# Patient Record
Sex: Female | Born: 1985 | Race: Black or African American | Hispanic: No | Marital: Married | Smoking: Never smoker
Health system: Southern US, Community
[De-identification: ages and names within clinical notes are randomized; demographics above are authoritative.]

## PROBLEM LIST (undated history)

## (undated) DIAGNOSIS — O24419 Gestational diabetes mellitus in pregnancy, unspecified control: Secondary | ICD-10-CM

## (undated) DIAGNOSIS — E079 Disorder of thyroid, unspecified: Secondary | ICD-10-CM

## (undated) DIAGNOSIS — R079 Chest pain, unspecified: Secondary | ICD-10-CM

## (undated) DIAGNOSIS — E039 Hypothyroidism, unspecified: Secondary | ICD-10-CM

---
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
## 2006-05-23 ENCOUNTER — Emergency Department (HOSPITAL_COMMUNITY): Admission: AC | Admit: 2006-05-23 | Discharge: 2006-05-23 | Payer: Self-pay

## 2006-08-14 ENCOUNTER — Ambulatory Visit: Payer: Self-pay | Admitting: Obstetrics and Gynecology

## 2006-08-14 ENCOUNTER — Inpatient Hospital Stay (HOSPITAL_COMMUNITY): Admission: AD | Admit: 2006-08-14 | Discharge: 2006-08-14 | Payer: Self-pay | Admitting: Gynecology

## 2010-11-27 LAB — URINALYSIS, ROUTINE W REFLEX MICROSCOPIC
Bilirubin Urine: NEGATIVE
Glucose, UA: NEGATIVE
Hgb urine dipstick: NEGATIVE
Ketones, ur: NEGATIVE
Nitrite: NEGATIVE
Protein, ur: NEGATIVE
Specific Gravity, Urine: 1.01
Urobilinogen, UA: 0.2
pH: 6.5

## 2010-11-27 LAB — URINE MICROSCOPIC-ADD ON

## 2011-11-05 ENCOUNTER — Other Ambulatory Visit: Payer: Self-pay | Admitting: Obstetrics and Gynecology

## 2011-11-05 MED ORDER — ETONOGESTREL-ETHINYL ESTRADIOL 0.12-0.015 MG/24HR VA RING: VAGINAL_RING | VAGINAL | Status: DC

## 2011-11-05 NOTE — Telephone Encounter (Signed)
Lm on vm rx sent to pharm 

## 2011-11-21 ENCOUNTER — Ambulatory Visit: Payer: Self-pay | Admitting: Obstetrics and Gynecology

## 2013-04-12 ENCOUNTER — Other Ambulatory Visit: Payer: Self-pay | Admitting: Internal Medicine

## 2013-04-12 DIAGNOSIS — R002 Palpitations: Secondary | ICD-10-CM

## 2013-04-14 ENCOUNTER — Ambulatory Visit
Admission: RE | Admit: 2013-04-14 | Discharge: 2013-04-14 | Disposition: A | Discharge status: Home / Self Care | Payer: BC Managed Care – PPO | Source: Ambulatory Visit | Attending: Internal Medicine | Admitting: Internal Medicine

## 2013-04-14 DIAGNOSIS — R002 Palpitations: Secondary | ICD-10-CM

## 2013-04-27 ENCOUNTER — Other Ambulatory Visit: Payer: Self-pay | Admitting: Internal Medicine

## 2013-04-27 DIAGNOSIS — E049 Nontoxic goiter, unspecified: Secondary | ICD-10-CM

## 2013-05-06 ENCOUNTER — Ambulatory Visit (HOSPITAL_COMMUNITY)
Admission: RE | Admit: 2013-05-06 | Discharge: 2013-05-06 | Disposition: A | Discharge status: Home / Self Care | Payer: BC Managed Care – PPO | Source: Ambulatory Visit | Attending: Internal Medicine | Admitting: Internal Medicine

## 2013-05-06 ENCOUNTER — Ambulatory Visit (HOSPITAL_COMMUNITY): Payer: BC Managed Care – PPO

## 2013-05-06 DIAGNOSIS — E049 Nontoxic goiter, unspecified: Secondary | ICD-10-CM

## 2013-05-07 ENCOUNTER — Other Ambulatory Visit (HOSPITAL_COMMUNITY): Payer: BC Managed Care – PPO

## 2013-05-07 ENCOUNTER — Encounter (HOSPITAL_COMMUNITY)
Admission: RE | Admit: 2013-05-07 | Discharge: 2013-05-07 | Disposition: A | Discharge status: Home / Self Care | Payer: BC Managed Care – PPO | Source: Ambulatory Visit | Attending: Internal Medicine | Admitting: Internal Medicine

## 2013-05-07 DIAGNOSIS — E049 Nontoxic goiter, unspecified: Secondary | ICD-10-CM | POA: Diagnosis present

## 2013-05-07 MED ORDER — SODIUM IODIDE I 131 CAPSULE
7.0000 | Freq: Once | INTRAVENOUS | Status: AC | PRN
  Administered 2013-05-07: 7 via ORAL

## 2013-05-07 MED ORDER — SODIUM PERTECHNETATE TC 99M INJECTION
10.8000 | Freq: Once | INTRAVENOUS | Status: AC | PRN
  Administered 2013-05-07: 11 via INTRAVENOUS

## 2013-11-08 ENCOUNTER — Encounter (HOSPITAL_BASED_OUTPATIENT_CLINIC_OR_DEPARTMENT_OTHER): Payer: Self-pay | Admitting: Emergency Medicine

## 2013-11-08 ENCOUNTER — Emergency Department (HOSPITAL_BASED_OUTPATIENT_CLINIC_OR_DEPARTMENT_OTHER): Payer: Medicaid Other

## 2013-11-08 ENCOUNTER — Emergency Department (HOSPITAL_BASED_OUTPATIENT_CLINIC_OR_DEPARTMENT_OTHER)
Admission: EM | Admit: 2013-11-08 | Discharge: 2013-11-08 | Disposition: A | Discharge status: Home / Self Care | Payer: Medicaid Other | Attending: Emergency Medicine | Admitting: Emergency Medicine

## 2013-11-08 DIAGNOSIS — R221 Localized swelling, mass and lump, neck: Secondary | ICD-10-CM | POA: Diagnosis present

## 2013-11-08 DIAGNOSIS — R220 Localized swelling, mass and lump, head: Secondary | ICD-10-CM | POA: Diagnosis present

## 2013-11-08 DIAGNOSIS — IMO0001 Reserved for inherently not codable concepts without codable children: Secondary | ICD-10-CM | POA: Diagnosis not present

## 2013-11-08 DIAGNOSIS — R079 Chest pain, unspecified: Secondary | ICD-10-CM | POA: Diagnosis not present

## 2013-11-08 DIAGNOSIS — R5383 Other fatigue: Secondary | ICD-10-CM | POA: Diagnosis not present

## 2013-11-08 DIAGNOSIS — Z8639 Personal history of other endocrine, nutritional and metabolic disease: Secondary | ICD-10-CM | POA: Insufficient documentation

## 2013-11-08 DIAGNOSIS — Z862 Personal history of diseases of the blood and blood-forming organs and certain disorders involving the immune mechanism: Secondary | ICD-10-CM | POA: Diagnosis not present

## 2013-11-08 DIAGNOSIS — R5381 Other malaise: Secondary | ICD-10-CM | POA: Diagnosis not present

## 2013-11-08 DIAGNOSIS — R531 Weakness: Secondary | ICD-10-CM

## 2013-11-08 HISTORY — DX: Disorder of thyroid, unspecified: E07.9

## 2013-11-08 LAB — BASIC METABOLIC PANEL
Anion gap: 12 (ref 5–15)
BUN: 8 mg/dL (ref 6–23)
CO2: 25 mEq/L (ref 19–32)
Calcium: 9.5 mg/dL (ref 8.4–10.5)
Chloride: 102 mEq/L (ref 96–112)
Creatinine, Ser: 0.8 mg/dL (ref 0.50–1.10)
GFR calc Af Amer: >90 mL/min (ref >90)
GFR calc non Af Amer: >90 mL/min (ref >90)
Glucose, Bld: 97 mg/dL (ref 70–99)
Potassium: 4 mEq/L (ref 3.7–5.3)
Sodium: 139 mEq/L (ref 137–147)

## 2013-11-08 LAB — CBC WITH DIFFERENTIAL/PLATELET
Basophils Absolute: 0.1 10*3/uL (ref 0.0–0.1)
Basophils Relative: 1 % (ref 0–1)
Eosinophils Absolute: 0 10*3/uL (ref 0.0–0.7)
Eosinophils Relative: 0 % (ref 0–5)
HCT: 37.7 % (ref 36.0–46.0)
Hemoglobin: 13.1 g/dL (ref 12.0–15.0)
Lymphocytes Relative: 22 % (ref 12–46)
Lymphs Abs: 2.7 10*3/uL (ref 0.7–4.0)
MCH: 31 pg (ref 26.0–34.0)
MCHC: 34.7 g/dL (ref 30.0–36.0)
MCV: 89.3 fL (ref 78.0–100.0)
Monocytes Absolute: 1.1 10*3/uL — ABNORMAL HIGH (ref 0.1–1.0)
Monocytes Relative: 9 % (ref 3–12)
Neutro Abs: 8.5 10*3/uL — ABNORMAL HIGH (ref 1.7–7.7)
Neutrophils Relative %: 68 % (ref 43–77)
Platelets: 259 10*3/uL (ref 150–400)
RBC: 4.22 MIL/uL (ref 3.87–5.11)
RDW: 11.8 % (ref 11.5–15.5)
WBC: 12.3 10*3/uL — ABNORMAL HIGH (ref 4.0–10.5)

## 2013-11-08 LAB — TSH: TSH: 4.33 u[IU]/mL (ref 0.350–4.500)

## 2013-11-08 NOTE — ED Provider Notes (Signed)
CSN: [id number]     Arrival date & time 11/08/13  1832 History   First MD Initiated Contact with Patient 11/08/13 1931     Chief Complaint  Patient presents with  . Neck swelling      (Consider location/radiation/quality/duration/timing/severity/associated sxs/prior Treatment) Patient is a 28 y.o. female presenting with chest pain. The history is provided by the patient. No language interpreter was used.  Chest Pain Pain location:  L chest and R chest Pain quality: aching and tightness   Pain radiates to:  Does not radiate Associated symptoms: no abdominal pain, no dysphagia, no fever, no headache, no nausea, not vomiting and no weakness   Associated symptoms comment:  She has had a diagnosis of Hashimoto's disease earlier in the year, last seen by endocrinology in March of this year. She reports recent fatigue, chest tightness, increased fullness in her throat without difficulty swallowing solids or liquids, and muscle pain in her posterior and right neck. No fever, vomiting. She states her weight fluctuates up and down.    Past Medical History  Diagnosis Date  . Thyroid disease    History reviewed. No pertinent past surgical history. No family history on file. History  Substance Use Topics  . Smoking status: Never Smoker   . Smokeless tobacco: Not on file  . Alcohol Use: No   OB History   Grav Para Term Preterm Abortions TAB SAB Ect Mult Living                 Review of Systems  Constitutional: Negative for fever, chills and appetite change.  HENT: Negative.  Negative for trouble swallowing and voice change.        See HPI.  Respiratory: Negative.   Cardiovascular: Positive for chest pain.  Gastrointestinal: Negative.  Negative for nausea, vomiting and abdominal pain.  Genitourinary: Negative.  Negative for dysuria, vaginal discharge and menstrual problem.  Musculoskeletal: Positive for myalgias and neck pain.  Skin: Negative.  Negative for color change and rash.   Neurological: Negative.  Negative for weakness and headaches.      Allergies  Review of patient's allergies indicates no known allergies.  Home Medications   Prior to Admission medications   Medication Sig Start Date End Date Taking? Authorizing Provider  etonogestrel-ethinyl estradiol (NUVARING) 0.12-0.015 MG/24HR vaginal ring Insert vaginally and leave in place for 3 consecutive weeks, then remove for 1 week. 11/05/11   Hal Morales, MD   BP 135/91  Pulse 91  Temp(Src) 98.5 F (36.9 C) (Oral)  Resp 18  Ht  (1.549 m)  Wt 114 lb (51.71 kg)  BMI 21.55 kg/m2  SpO2 100%  LMP 11/07/2013 Physical Exam  Constitutional: She is oriented to person, place, and time. She appears well-developed and well-nourished.  HENT:  Head: Normocephalic.  Eyes: Conjunctivae are normal.  Neck: Normal range of motion. Neck supple. No thyromegaly present.  Cardiovascular: Normal rate and regular rhythm.   Pulmonary/Chest: Effort normal and breath sounds normal.  Abdominal: Soft. Bowel sounds are normal. There is no tenderness. There is no rebound and no guarding.  Musculoskeletal: Normal range of motion.  Neurological: She is alert and oriented to person, place, and time.  Skin: Skin is warm and dry. No rash noted.  Psychiatric: She has a normal mood and affect.    ED Course  Procedures (including critical care time) Labs Review Labs Reviewed  BASIC METABOLIC PANEL  CBC WITH DIFFERENTIAL   Results for orders placed during the hospital encounter of  11/08/13  BASIC METABOLIC PANEL      Result Value Ref Range   Sodium 139  137 - 147 mEq/L   Potassium 4.0  3.7 - 5.3 mEq/L   Chloride 102  96 - 112 mEq/L   CO2 25  19 - 32 mEq/L   Glucose, Bld 97  70 - 99 mg/dL   BUN 8  6 - 23 mg/dL   Creatinine, Ser 2.95  0.50 - 1.10 mg/dL   Calcium 9.5  8.4 - 62.1 mg/dL   GFR calc non Af Amer >90  >90 mL/min   GFR calc Af Amer >90  >90 mL/min   Anion gap 12  5 - 15  CBC WITH DIFFERENTIAL       Result Value Ref Range   WBC 12.3 (*) 4.0 - 10.5 K/uL   RBC 4.22  3.87 - 5.11 MIL/uL   Hemoglobin 13.1  12.0 - 15.0 g/dL   HCT 30.8  65.7 - 84.6 %   MCV 89.3  78.0 - 100.0 fL   MCH 31.0  26.0 - 34.0 pg   MCHC 34.7  30.0 - 36.0 g/dL   RDW 96.2  95.2 - 84.1 %   Platelets 259  150 - 400 K/uL   Neutrophils Relative % 68  43 - 77 %   Neutro Abs 8.5 (*) 1.7 - 7.7 K/uL   Lymphocytes Relative 22  12 - 46 %   Lymphs Abs 2.7  0.7 - 4.0 K/uL   Monocytes Relative 9  3 - 12 %   Monocytes Absolute 1.1 (*) 0.1 - 1.0 K/uL   Eosinophils Relative 0  0 - 5 %   Eosinophils Absolute 0.0  0.0 - 0.7 K/uL   Basophils Relative 1  0 - 1 %   Basophils Absolute 0.1  0.0 - 0.1 K/uL   Dg Chest 2 View  11/08/2013   CLINICAL DATA:  Chest tightness  EXAM: CHEST  2 VIEW  COMPARISON:  None.  FINDINGS: Lungs are clear. Heart size and pulmonary vascularity are normal. No adenopathy. No pneumothorax. No bone lesions.  IMPRESSION: No abnormality noted.   Electronically Signed   By: Bretta Bang M.D.   On: 11/08/2013 21:14    Imaging Review No results found.   EKG Interpretation None      MDM   Final diagnoses:  None    1. Weakness 2. History of hypothyroid  Lab studies and imaging today are essentially normal. Suspect current symptoms are related to history of thyroid disease that will require outpatient management. Thyroid studies obtained for her physician to follow up outpatient as well. VSS. She is well appearing and can be discharged home.     Arnoldo Hooker, PA-C 11/08/13 2136

## 2013-11-08 NOTE — ED Notes (Signed)
Patient transported to X-ray 

## 2013-11-08 NOTE — Discharge Instructions (Signed)
YOUR LAB AND X-RAY STUDIES TODAY ARE ESSENTIALLY NORMAL AND YOU CAN BE DISCHARGED HOME. IT IS IMPORTANT TO SEE YOUR PRIMARY CARE OR ENDOCRINE DOCTOR FOR FURTHER MANAGEMENT AND EVALUATION. RETURN TO THE EMERGENCY DEPARTMENT AS NEEDED.

## 2013-11-08 NOTE — ED Notes (Signed)
States she was diagnosed with Hashimoto disease in March. She was not started on medication. Her MD is watching her first. Swelling to her neck x 3 weeks and shoulders today. She is speaking in complete sentences and no difficulty swallowing.

## 2013-11-09 LAB — T4, FREE: Free T4: 1.24 ng/dL (ref 0.80–1.80)

## 2013-11-09 NOTE — ED Provider Notes (Signed)
Medical screening examination/treatment/procedure(s) were performed by non-physician practitioner and as supervising physician I was immediately available for consultation/collaboration.   EKG Interpretation None        Forrest Harrison, MD 11/09/13 2306 

## 2014-01-25 ENCOUNTER — Ambulatory Visit: Payer: BC Managed Care – PPO | Admitting: Internal Medicine

## 2014-01-26 ENCOUNTER — Encounter: Payer: Self-pay | Admitting: Internal Medicine

## 2014-01-26 ENCOUNTER — Ambulatory Visit: Payer: Medicaid Other | Attending: Internal Medicine | Admitting: Internal Medicine

## 2014-01-26 VITALS — BP 117/76 | HR 88 | Temp 98.2°F | Resp 16 | Ht 61.0 in | Wt 118.0 lb

## 2014-01-26 DIAGNOSIS — Z139 Encounter for screening, unspecified: Secondary | ICD-10-CM

## 2014-01-26 DIAGNOSIS — Z8639 Personal history of other endocrine, nutritional and metabolic disease: Secondary | ICD-10-CM

## 2014-01-26 LAB — COMPLETE METABOLIC PANEL WITH GFR
ALT: 15 U/L (ref 0–35)
AST: 20 U/L (ref 0–37)
Albumin: 3.9 g/dL (ref 3.5–5.2)
Alkaline Phosphatase: 57 U/L (ref 39–117)
BUN: 8 mg/dL (ref 6–23)
CO2: 26 mEq/L (ref 19–32)
Calcium: 9.3 mg/dL (ref 8.4–10.5)
Chloride: 104 mEq/L (ref 96–112)
Creat: 0.64 mg/dL (ref 0.50–1.10)
GFR, Est African American: >89 mL/min
GFR, Est Non African American: >89 mL/min
Glucose, Bld: 92 mg/dL (ref 70–99)
Potassium: 4.4 mEq/L (ref 3.5–5.3)
Sodium: 137 mEq/L (ref 135–145)
Total Bilirubin: 0.5 mg/dL (ref 0.2–1.2)
Total Protein: 6.9 g/dL (ref 6.0–8.3)

## 2014-01-26 NOTE — Progress Notes (Signed)
Patient Demographics  Rebecca Cooley, is a 28 y.o. female  ZOX:[id number]  [id number]  DOB - August 17, 1985  CC:  Chief Complaint  Patient presents with  . Establish Care       HPI: Rebecca Cooley is a 28 y.o. female here today to establish medical care.patient has history of thyroiditis in the past and had ultrasound done and which reported no nodules or cyst, in the month of September she had her TSH and free T4 level checked which was normal, patient reported to have gained a few pounds and does report heavy menstrual bleeding and wants to be checked again thyroid test, she denies any fever chills chest and shortness of breath. Patient has No headache, No chest pain, No abdominal pain - No Nausea, No new weakness tingling or numbness, No Cough - SOB.  No Known Allergies Past Medical History  Diagnosis Date  . Thyroid disease    Current Outpatient Prescriptions on File Prior to Visit  Medication Sig Dispense Refill  . etonogestrel-ethinyl estradiol (NUVARING) 0.12-0.015 MG/24HR vaginal ring Insert vaginally and leave in place for 3 consecutive weeks, then remove for 1 week. (Patient not taking: Reported on 01/26/2014) 1 each 0   No current facility-administered medications on file prior to visit.   Family History  Problem Relation Age of Onset  . Hypertension Mother   . Dementia Father   . Diabetes Maternal Grandmother    History   Social History  . Marital Status: Single    Spouse Name: N/A    Number of Children: N/A  . Years of Education: N/A   Occupational History  . Not on file.   Social History Main Topics  . Smoking status: Never Smoker   . Smokeless tobacco: Not on file  . Alcohol Use: No  . Drug Use: No  . Sexual Activity: Not on file   Other Topics Concern  . Not on file   Social History Narrative    Review of Systems: Constitutional: Negative for fever, chills, diaphoresis, activity change, appetite change and fatigue. HENT: Negative  for ear pain, nosebleeds, congestion, facial swelling, rhinorrhea, neck pain, neck stiffness and ear discharge.  Eyes: Negative for pain, discharge, redness, itching and visual disturbance. Respiratory: Negative for cough, choking, chest tightness, shortness of breath, wheezing and stridor.  Cardiovascular: Negative for chest pain, palpitations and leg swelling. Gastrointestinal: Negative for abdominal distention. Genitourinary: Negative for dysuria, urgency, frequency, hematuria, flank pain, decreased urine volume, difficulty urinating and dyspareunia.  Musculoskeletal: Negative for back pain, joint swelling, arthralgia and gait problem. Neurological: Negative for dizziness, tremors, seizures, syncope, facial asymmetry, speech difficulty, weakness, light-headedness, numbness and headaches.  Hematological: Negative for adenopathy. Does not bruise/bleed easily. Psychiatric/Behavioral: Negative for hallucinations, behavioral problems, confusion, dysphoric mood, decreased concentration and agitation.    Objective:   Filed Vitals:   01/26/14 1512  BP: 117/76  Pulse: 88  Temp: 98.2 F (36.8 C)  Resp: 16    Physical Exam: Constitutional: Patient appears well-developed and well-nourished. No distress. HENT: Normocephalic, atraumatic, External right and left ear normal. Oropharynx is clear and moist.  Eyes: Conjunctivae and EOM are normal. PERRLA, no scleral icterus. Neck: Normal ROM. Neck supple. No JVD. No tracheal deviation. + thyromegaly. CVS: RRR, S1/S2 +, no murmurs, no gallops, no carotid bruit.  Pulmonary: Effort and breath sounds normal, no stridor, rhonchi, wheezes, rales.  Abdominal: Soft. BS +, no distension, tenderness, rebound or guarding.  Musculoskeletal: Normal range of motion. No edema and no tenderness.  Neuro: Alert. Normal reflexes, muscle tone coordination. No cranial nerve deficit. Skin: Skin is warm and dry. No rash noted. Not diaphoretic. No erythema. No  pallor. Psychiatric: Normal mood and affect. Behavior, judgment, thought content normal.  Lab Results  Component Value Date   WBC 12.3* 11/08/2013   HGB 13.1 11/08/2013   HCT 37.7 11/08/2013   MCV 89.3 11/08/2013   PLT 259 11/08/2013   Lab Results  Component Value Date   CREATININE 0.80 11/08/2013   BUN 8 11/08/2013   NA 139 11/08/2013   K 4.0 11/08/2013   CL 102 11/08/2013   CO2 25 11/08/2013    No results found for: HGBA1C Lipid Panel  No results found for: CHOL, TRIG, HDL, CHOLHDL, VLDL, LDLCALC     Assessment and plan:   1. History of thyroiditis Will recheck  - T4, free;  - TSH  2. Screening Ordered baseline blood work. - CBC with Differential - COMPLETE METABOLIC PANEL WITH GFR - Vit D  25 hydroxy (rtn osteoporosis monitoring) - Hemoglobin A1c      Health Maintenance  -Pap Smear: uptodate   -Vaccinations:  Patient declines flu shot   Return in about 3 months (around 04/27/2014), or if symptoms worsen or fail to improve.   Doris CheadleADVANI, DEEPAK, MD

## 2014-01-26 NOTE — Progress Notes (Signed)
Establish Care Hx TSH, not taking medication

## 2014-01-27 LAB — CBC WITH DIFFERENTIAL/PLATELET
Basophils Absolute: 0.1 10*3/uL (ref 0.0–0.1)
Basophils Relative: 1 % (ref 0–1)
Eosinophils Absolute: 0.1 10*3/uL (ref 0.0–0.7)
Eosinophils Relative: 1 % (ref 0–5)
HCT: 36.7 % (ref 36.0–46.0)
Hemoglobin: 12.7 g/dL (ref 12.0–15.0)
Lymphocytes Relative: 18 % (ref 12–46)
Lymphs Abs: 1.8 10*3/uL (ref 0.7–4.0)
MCH: 29.8 pg (ref 26.0–34.0)
MCHC: 34.6 g/dL (ref 30.0–36.0)
MCV: 86.2 fL (ref 78.0–100.0)
MPV: 10.5 fL (ref 9.4–12.4)
Monocytes Absolute: 0.9 10*3/uL (ref 0.1–1.0)
Monocytes Relative: 9 % (ref 3–12)
Neutro Abs: 7 10*3/uL (ref 1.7–7.7)
Neutrophils Relative %: 71 % (ref 43–77)
Platelets: 289 10*3/uL (ref 150–400)
RBC: 4.26 MIL/uL (ref 3.87–5.11)
RDW: 12.9 % (ref 11.5–15.5)
WBC: 9.9 10*3/uL (ref 4.0–10.5)

## 2014-01-27 LAB — HEMOGLOBIN A1C
Hgb A1c MFr Bld: 5.6 % (ref <5.7)
Mean Plasma Glucose: 114 mg/dL (ref <117)

## 2014-01-27 LAB — T4, FREE: Free T4: 0.92 ng/dL (ref 0.80–1.80)

## 2014-01-27 LAB — TSH: TSH: 1.471 u[IU]/mL (ref 0.350–4.500)

## 2014-01-27 LAB — VITAMIN D 25 HYDROXY (VIT D DEFICIENCY, FRACTURES): Vit D, 25-Hydroxy: 24 ng/mL — ABNORMAL LOW (ref 30–100)

## 2014-05-03 ENCOUNTER — Encounter (HOSPITAL_BASED_OUTPATIENT_CLINIC_OR_DEPARTMENT_OTHER): Payer: Self-pay | Admitting: *Deleted

## 2014-05-03 ENCOUNTER — Other Ambulatory Visit: Payer: Self-pay | Admitting: Emergency Medicine

## 2014-05-03 ENCOUNTER — Emergency Department (HOSPITAL_BASED_OUTPATIENT_CLINIC_OR_DEPARTMENT_OTHER)
Admission: EM | Admit: 2014-05-03 | Discharge: 2014-05-03 | Disposition: A | Discharge status: Home / Self Care | Payer: Medicaid Other | Attending: Emergency Medicine | Admitting: Emergency Medicine

## 2014-05-03 DIAGNOSIS — Z8639 Personal history of other endocrine, nutritional and metabolic disease: Secondary | ICD-10-CM | POA: Diagnosis not present

## 2014-05-03 DIAGNOSIS — J020 Streptococcal pharyngitis: Secondary | ICD-10-CM | POA: Insufficient documentation

## 2014-05-03 DIAGNOSIS — J029 Acute pharyngitis, unspecified: Secondary | ICD-10-CM | POA: Diagnosis present

## 2014-05-03 LAB — RAPID STREP SCREEN (MED CTR MEBANE ONLY): Streptococcus, Group A Screen (Direct): NEGATIVE

## 2014-05-03 MED ORDER — DEXAMETHASONE 4 MG PO TABS
ORAL_TABLET | ORAL | Status: AC
  Filled 2014-05-03: qty 3

## 2014-05-03 MED ORDER — DEXAMETHASONE 4 MG PO TABS
10.0000 mg | ORAL_TABLET | Freq: Once | ORAL | Status: AC
  Administered 2014-05-03: 10 mg via ORAL

## 2014-05-03 MED ORDER — PENICILLIN G BENZATHINE 1200000 UNIT/2ML IM SUSP
1.2000 10*6.[IU] | Freq: Once | INTRAMUSCULAR | Status: AC
  Administered 2014-05-03: 1.2 10*6.[IU] via INTRAMUSCULAR
  Filled 2014-05-03: qty 2

## 2014-05-03 MED ORDER — DEXAMETHASONE 4 MG PO TABS
ORAL_TABLET | ORAL | Status: AC
  Administered 2014-05-03: 10 mg via ORAL
  Filled 2014-05-03: qty 3

## 2014-05-03 NOTE — ED Provider Notes (Addendum)
CSN: [id number]     Arrival date & time 05/03/14  1425 History   First MD Initiated Contact with Patient 05/03/14 1438     Chief Complaint  Patient presents with  . Sore Throat     (Consider location/radiation/quality/duration/timing/severity/associated sxs/prior Treatment) Patient is a 29 y.o. female presenting with pharyngitis.  Sore Throat This is a new problem. The current episode started yesterday. The problem occurs constantly. The problem has not changed since onset.Pertinent negatives include no chest pain, no abdominal pain, no headaches and no shortness of breath. The symptoms are aggravated by swallowing and eating. Nothing relieves the symptoms.    Past Medical History  Diagnosis Date  . Thyroid disease    History reviewed. No pertinent past surgical history. Family History  Problem Relation Age of Onset  . Hypertension Mother   . Dementia Father   . Diabetes Maternal Grandmother    History  Substance Use Topics  . Smoking status: Never Smoker   . Smokeless tobacco: Not on file  . Alcohol Use: No   OB History    No data available     Review of Systems  Respiratory: Negative for shortness of breath.   Cardiovascular: Negative for chest pain.  Gastrointestinal: Negative for abdominal pain.  Neurological: Negative for headaches.  All other systems reviewed and are negative.     Allergies  Review of patient's allergies indicates no known allergies.  Home Medications   Prior to Admission medications   Medication Sig Start Date End Date Taking? Authorizing Provider  etonogestrel-ethinyl estradiol (NUVARING) 0.12-0.015 MG/24HR vaginal ring Insert vaginally and leave in place for 3 consecutive weeks, then remove for 1 week. Patient not taking: Reported on 01/26/2014 11/05/11   Hal Morales, MD   BP 123/77 mmHg  Pulse 110  Temp(Src) 99.4 F (37.4 C) (Oral)  Resp 16  Ht  (1.549 m)  Wt 110 lb (49.896 kg)  BMI 20.80 kg/m2  SpO2 100%  LMP  04/04/2014 (Approximate) Physical Exam  Constitutional: She is oriented to person, place, and time. She appears well-developed and well-nourished.  HENT:  Head: Normocephalic and atraumatic.  Right Ear: External ear normal.  Left Ear: External ear normal.  Mouth/Throat: Posterior oropharyngeal edema and posterior oropharyngeal erythema (tonsillar exudate bil) present.  Eyes: Conjunctivae and EOM are normal. Pupils are equal, round, and reactive to light.  Neck: Normal range of motion. Neck supple.  Cardiovascular: Normal rate, regular rhythm, normal heart sounds and intact distal pulses.   Pulmonary/Chest: Effort normal and breath sounds normal.  Abdominal: Soft. Bowel sounds are normal. There is no tenderness.  Musculoskeletal: Normal range of motion.  Neurological: She is alert and oriented to person, place, and time.  Skin: Skin is warm and dry.  Vitals reviewed.   ED Course  Procedures (including critical care time) Labs Review Labs Reviewed  RAPID STREP SCREEN    Imaging Review No results found.   EKG Interpretation None      MDM   Final diagnoses:  Strep pharyngitis    29 y.o. female without pertinent PMH presents with sore throat x 1 day.  Physical exam with tender LAD, tonsillar exudate, posterior erythema.  She has been chilled at home, no measured fevers.  Consider strep likely.  Empirically treated with penicillin, decadron.     I have reviewed all laboratory and imaging studies if ordered as above  1. Strep pharyngitis         Mirian Mo, MD 05/03/14 1503  Mirian Mo,  MD 05/03/14 1539

## 2014-05-03 NOTE — Discharge Instructions (Signed)

## 2014-05-03 NOTE — ED Notes (Signed)
Pt c/o sore throat and cold chills that began last night.

## 2014-05-05 LAB — CULTURE, GROUP A STREP: Strep A Culture: NEGATIVE

## 2015-09-20 DIAGNOSIS — Z1159 Encounter for screening for other viral diseases: Secondary | ICD-10-CM | POA: Diagnosis not present

## 2015-09-20 DIAGNOSIS — N760 Acute vaginitis: Secondary | ICD-10-CM | POA: Diagnosis not present

## 2015-09-20 DIAGNOSIS — Z113 Encounter for screening for infections with a predominantly sexual mode of transmission: Secondary | ICD-10-CM | POA: Diagnosis not present

## 2015-09-20 DIAGNOSIS — Z114 Encounter for screening for human immunodeficiency virus [HIV]: Secondary | ICD-10-CM | POA: Diagnosis not present

## 2015-09-20 DIAGNOSIS — R350 Frequency of micturition: Secondary | ICD-10-CM | POA: Diagnosis not present

## 2015-10-09 DIAGNOSIS — E063 Autoimmune thyroiditis: Secondary | ICD-10-CM | POA: Diagnosis not present

## 2015-10-09 DIAGNOSIS — R002 Palpitations: Secondary | ICD-10-CM | POA: Diagnosis not present

## 2015-10-09 DIAGNOSIS — R9430 Abnormal result of cardiovascular function study, unspecified: Secondary | ICD-10-CM | POA: Diagnosis not present

## 2015-10-11 DIAGNOSIS — E063 Autoimmune thyroiditis: Secondary | ICD-10-CM | POA: Diagnosis not present

## 2015-10-24 DIAGNOSIS — Z8639 Personal history of other endocrine, nutritional and metabolic disease: Secondary | ICD-10-CM | POA: Diagnosis not present

## 2015-10-24 DIAGNOSIS — R002 Palpitations: Secondary | ICD-10-CM | POA: Diagnosis not present

## 2015-12-04 DIAGNOSIS — Z6821 Body mass index (BMI) 21.0-21.9, adult: Secondary | ICD-10-CM | POA: Diagnosis not present

## 2015-12-04 DIAGNOSIS — Z01419 Encounter for gynecological examination (general) (routine) without abnormal findings: Secondary | ICD-10-CM | POA: Diagnosis not present

## 2015-12-15 DIAGNOSIS — T300 Burn of unspecified body region, unspecified degree: Secondary | ICD-10-CM | POA: Diagnosis not present

## 2016-05-21 DIAGNOSIS — R002 Palpitations: Secondary | ICD-10-CM | POA: Diagnosis not present

## 2016-05-21 DIAGNOSIS — R5383 Other fatigue: Secondary | ICD-10-CM | POA: Diagnosis not present

## 2016-05-23 DIAGNOSIS — Z3042 Encounter for surveillance of injectable contraceptive: Secondary | ICD-10-CM | POA: Diagnosis not present

## 2016-08-20 DIAGNOSIS — Z3042 Encounter for surveillance of injectable contraceptive: Secondary | ICD-10-CM | POA: Diagnosis not present

## 2016-11-14 DIAGNOSIS — Z3202 Encounter for pregnancy test, result negative: Secondary | ICD-10-CM | POA: Diagnosis not present

## 2016-11-14 DIAGNOSIS — Z3042 Encounter for surveillance of injectable contraceptive: Secondary | ICD-10-CM | POA: Diagnosis not present

## 2016-12-05 DIAGNOSIS — Z1151 Encounter for screening for human papillomavirus (HPV): Secondary | ICD-10-CM | POA: Diagnosis not present

## 2016-12-05 DIAGNOSIS — Z01419 Encounter for gynecological examination (general) (routine) without abnormal findings: Secondary | ICD-10-CM | POA: Diagnosis not present

## 2016-12-05 DIAGNOSIS — Z6823 Body mass index (BMI) 23.0-23.9, adult: Secondary | ICD-10-CM | POA: Diagnosis not present

## 2017-02-06 DIAGNOSIS — Z3042 Encounter for surveillance of injectable contraceptive: Secondary | ICD-10-CM | POA: Diagnosis not present

## 2017-04-21 ENCOUNTER — Ambulatory Visit (INDEPENDENT_AMBULATORY_CARE_PROVIDER_SITE_OTHER): Payer: BLUE CROSS/BLUE SHIELD | Admitting: Family Medicine

## 2017-04-21 ENCOUNTER — Other Ambulatory Visit: Payer: Self-pay

## 2017-04-21 ENCOUNTER — Encounter: Payer: Self-pay | Admitting: Family Medicine

## 2017-04-21 VITALS — BP 110/62 | HR 109 | Temp 98.2°F | Ht 61.0 in | Wt 134.0 lb

## 2017-04-21 DIAGNOSIS — Z8639 Personal history of other endocrine, nutritional and metabolic disease: Secondary | ICD-10-CM

## 2017-04-21 DIAGNOSIS — M222X2 Patellofemoral disorders, left knee: Secondary | ICD-10-CM | POA: Diagnosis not present

## 2017-04-21 DIAGNOSIS — Z319 Encounter for procreative management, unspecified: Secondary | ICD-10-CM | POA: Diagnosis not present

## 2017-04-21 NOTE — Patient Instructions (Signed)
Thank you for coming to see me today. It was a pleasure! Today we talked about:   Your previous thyroiditis. We will call you with your lab results.  Your anterior knee pain. I have attached a hand out and stretches as well as walking periodically should help.   Please follow-up with me as needed.  If you have any questions or concerns, please do not hesitate to call the office at [phone].  Take Care,   [place] Shirley, DO  Knee Pain, Adult Many things can cause knee pain. The pain often goes away on its own with time and rest. If the pain does not go away, tests may be done to find out what is causing the pain. Follow these instructions at home: Activity  Rest your knee.  Do not do things that cause pain.  Avoid activities where both feet leave the ground at the same time (high-impact activities). Examples are running, jumping rope, and doing jumping jacks. General instructions  Take medicines only as told by your doctor.  Raise (elevate) your knee when you are resting. Make sure your knee is higher than your heart.  Sleep with a pillow under your knee.  If told, put ice on the knee: ? Put ice in a plastic bag. ? Place a towel between your skin and the bag. ? Leave the ice on for 20 minutes, 2-3 times a day.  Ask your doctor if you should wear an elastic knee support.  Lose weight if you are overweight. Being overweight can make your knee hurt more.  Do not use any tobacco products. These include cigarettes, chewing tobacco, or electronic cigarettes. If you need help quitting, ask your doctor. Smoking may slow down healing. Contact a doctor if:  The pain does not stop.  The pain changes or gets worse.  You have a fever along with knee pain.  Your knee gives out or locks up.  Your knee swells, and becomes worse. Get help right away if:  Your knee feels warm.  You cannot move your knee.  You have very bad knee pain.  You have chest pain.  You have  trouble breathing. Summary  Many things can cause knee pain. The pain often goes away on its own with time and rest.  Avoid activities that put stress on your knee. These include running and jumping rope.  Get help right away if you cannot move your knee, or if your knee feels warm, or if you have trouble breathing. This information is not intended to replace advice given to you by your health care provider. Make sure you discuss any questions you have with your health care provider. Document Released: 04/26/2008 Document Revised: 01/23/2016 Document Reviewed: 01/23/2016 Elsevier Interactive Patient Education  2017 [employer]

## 2017-04-21 NOTE — Progress Notes (Signed)
Subjective:    Patient ID: Rebecca Cooley, female    DOB: 1985/10/07, 32 y.o.   MRN: [id number]   CC: Establish care with new doctor.  HPI:  Hx of Hashimoto's and concern for Hypothyroidism: -Patient reporting that she has feelings of fatigue, "brain fog" -Patient reporting that this is been going on for a couple of months - Patient reports that she recently had a normal free T4 level at her work. -Patient is concerned that she is having symptoms related to her thyroid however. -Patient reporting that she feels as if she has palpitations or heart racing from time to time, patient also reporting that she is having memory problems, as well as cold intolerance.  Desire for Pregnancy: -Last Depo shot in January -Patient desire genetic counseling for her and her partner prior to trying to conceive -Patient is followed by OB/GYN for recently discontinued Depo shot and will manage her pregnancy.  Anterior knee pain: -Patient describing anterior knee pain that is worse after sitting for prolonged periods of time - Patient reports that she has not tried anything for this yet -Denies popping, clicking or instability - has been occurring for months - Patient reports no falls or trauma  Review of Systems  Constitutional: Negative.  Negative for chills and fever.  HENT: Negative.   Eyes: Negative.   Respiratory: Negative.   Cardiovascular: Positive for palpitations. Negative for chest pain and leg swelling.  Gastrointestinal: Negative.  Negative for abdominal pain, nausea and vomiting.  Genitourinary: Negative.   Musculoskeletal: Negative.   Skin: Negative.   Neurological: Negative.   Psychiatric/Behavioral: Negative for depression.       Brain fog    Patient Active Problem List   Diagnosis Date Noted  . Desire for pregnancy 04/28/2017  . 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 History of thyroiditis 01/26/2014     Family History  Problem Relation Age of Onset   . Hypertension Mother   . Diabetes Mother   . Dementia Father   . Diabetes Maternal Grandmother     Past Medical History:  Diagnosis Date  . Thyroid disease     Social Hx: Denies tobacco use, illicit drug use.  Patient reports drinking 2-3 large glasses of wine for more times a week, after work> Cage questionnaire negative  Objective:  BP 110/62   Pulse (!) 109   Temp 98.2 F (36.8 C) (Oral)   Ht 5\' 1"  (1.549 m)   Wt 134 lb (60.8 kg)   SpO2 98%   BMI 25.32 kg/m  Vitals and nursing note reviewed  General: NAD, pleasant Head: Atraumatic Neck: Supple, mildly enlarged thyroid palpable on exam Cardiac: RRR, normal heart sounds, no murmurs Respiratory: CTAB, normal effort Abdomen: soft, nontender, nondistended. Bowel sounds present Extremities: no edema or cyanosis. WWP. MSK: normal gait Skin: warm and dry, no rashes noted Neuro: alert and oriented, no focal deficits Psych: Neatly groomed and appropriately dressed. Maintains good eye contact and is cooperative and attentive. Speech is normal volume and rate. Normal affect.  Assessment & Plan:    History of thyroiditis TSH elevated to 5.880, Free T4 wnl. May require follow up thyroid US, previously normal in 2015. Will continue to follow along. Doubt this is major cause of symptoms. Given level will not treat until TSH >7.00 per new recommendations.  Patient may require further evaluation or other causes of fatigue including PHQ-9.   Desire for pregnancy Patient recently stopped her Depo injections and desires to have genetic  counseling prior to trying to conceive. Patient to be called and instructed to start pre-natal vitamins.   Patellofemoral pain syndrome of left knee Patient encouraged to walk during work, and given handout for stretches. Patient to follow up and told she could use NSAID's periodically for the pain if it is bad, however she reports that it is not affecting her day to day.   [place] Shirley,  DO Family Medicine Resident, PGY-1

## 2017-04-22 LAB — TSH: TSH: 5.88 u[IU]/mL — ABNORMAL HIGH (ref 0.450–4.500)

## 2017-04-22 LAB — T4, FREE: Free T4: 0.96 ng/dL (ref 0.82–1.77)

## 2017-04-28 ENCOUNTER — Encounter: Payer: Self-pay | Admitting: Family Medicine

## 2017-04-28 DIAGNOSIS — M222X2 Patellofemoral disorders, left knee: Secondary | ICD-10-CM | POA: Insufficient documentation

## 2017-04-28 DIAGNOSIS — Z319 Encounter for procreative management, unspecified: Secondary | ICD-10-CM | POA: Insufficient documentation

## 2017-04-28 NOTE — Assessment & Plan Note (Signed)
TSH elevated to 5.880, Free T4 wnl. May require follow up thyroid US, previously normal in 2015. Will continue to follow along. Doubt this is major cause of symptoms. Given level will not treat until TSH >7.00 per new recommendations.  Patient may require further evaluation or other causes of fatigue including PHQ-9.

## 2017-04-28 NOTE — Assessment & Plan Note (Signed)
Patient encouraged to walk during work, and given handout for stretches. Patient to follow up and told she could use NSAID's periodically for the pain if it is bad, however she reports that it is not affecting her day to day.

## 2017-04-28 NOTE — Assessment & Plan Note (Signed)
Patient recently stopped her Depo injections and desires to have genetic counseling prior to trying to conceive. Patient to be called and instructed to start pre-natal vitamins.

## 2017-05-14 ENCOUNTER — Telehealth: Payer: Self-pay | Admitting: Family Medicine

## 2017-05-14 NOTE — Telephone Encounter (Signed)
Pt wants to have Dr Talbert ForestShirley contact her to discuss her TSH results.

## 2017-05-15 NOTE — Telephone Encounter (Signed)
Attempted to call patient about TSH results. Left message for patient to call office tomorrow.

## 2017-05-21 NOTE — Progress Notes (Deleted)
   [facility] Phone: [phone]   Date of Visit: 05/22/2017   HPI:  ***  ROS: See HPI.  PMFSH: ***  PHYSICAL EXAM: There were no vitals taken for this visit. Gen: *** HEENT: *** Heart: *** Lungs: *** Neuro: *** Ext: ***  ASSESSMENT/PLAN:  Health maintenance:  -***  No problem-specific Assessment & Plan notes found for this encounter.  FOLLOW UP: Follow up in *** for ***  Kanishka G Gunadasa, MD PGY 2 [facility]  

## 2017-05-22 ENCOUNTER — Ambulatory Visit: Payer: BLUE CROSS/BLUE SHIELD | Admitting: Internal Medicine

## 2017-05-22 NOTE — Progress Notes (Signed)
   [facility] Phone: [phone]   Date of Visit: 05/23/2017   HPI:  Neck Swelling:  - reports that she noticed a bulge on the right neck about 1 month ago. She reports sometimes it is painful in that "it feels like something being inflamed".  - she feels like her thyroid is inflamed at times  - she has a history of hashimoto  Palpitations: - reports of discomfort in chest x 6 months  - it is intermittent but has increased in frequency - it can last all day  - no radiation of pain  - more noticeable when she is active - no associated nausea or diaphoresis - reports of shortness of breath sometimes - denies anxiety. Reports she is planning her sister's wedding which can be stressful. She had similar symptoms a year ago when she was planning her wedding.  - no family history of heart disease to her knowledge - no smoking history  ROS: See HPI.  PMFSH:  PMH:  History of Hashimoto Thyroiditis   PHYSICAL EXAM: BP 106/62   Pulse 93   Temp 98.4 F (36.9 C) (Oral)   Ht 5\' 1"  (1.549 m)   Wt 134 lb (60.8 kg)   SpO2 99%   BMI 25.32 kg/m  GEN: NAD HEENT: Atraumatic, normocephalic, palpable thyroid that seems enlarged but no distinct nodules, no cervical lymphadenopathy, EOMI, sclera clear  CV: RRR, no murmurs, rubs, or gallops, no tenderness to palpation of chest wall  PULM: CTAB, normal effort SKIN: No rash or cyanosis; warm and well-perfused EXTR: No lower extremity edema or calf tenderness PSYCH: Mood and affect euthymic, normal rate and volume of speech NEURO: Awake, alert, no focal deficits grossly, normal speech  EKG: NSR with no signs of ischemia  ASSESSMENT/PLAN:  Health maintenance:  -Tdap today   Palpitations Might be stress related but will do basic blood work for further evaluation. If blood work normal, will consider holter monitor. Chest pain unlikely ACS related. EKG is unremarkable. No risk factors.  - EKG 12-Lead - CBC - Basic  metabolic panel - TSH - T4, free - T3, Free  Thyroid goiter Has history of hashimoto thyroiditis. Thyroid appears enlarged but no focal nodules. Will obtain TSH, free T3 and T4, and Thyroid US.  - TSH - T4, free - T3, Free - US THYROID; Future  Palma HolterKanishka G Gunadasa, MD PGY 3 [facility]

## 2017-05-23 ENCOUNTER — Ambulatory Visit (INDEPENDENT_AMBULATORY_CARE_PROVIDER_SITE_OTHER): Payer: BLUE CROSS/BLUE SHIELD | Admitting: Internal Medicine

## 2017-05-23 ENCOUNTER — Other Ambulatory Visit: Payer: Self-pay

## 2017-05-23 ENCOUNTER — Ambulatory Visit (HOSPITAL_COMMUNITY)
Admission: RE | Admit: 2017-05-23 | Discharge: 2017-05-23 | Disposition: A | Discharge status: Home / Self Care | Payer: BLUE CROSS/BLUE SHIELD | Source: Ambulatory Visit | Attending: Family Medicine | Admitting: Family Medicine

## 2017-05-23 ENCOUNTER — Encounter: Payer: Self-pay | Admitting: Internal Medicine

## 2017-05-23 VITALS — BP 106/62 | HR 93 | Temp 98.4°F | Ht 61.0 in | Wt 134.0 lb

## 2017-05-23 DIAGNOSIS — R002 Palpitations: Secondary | ICD-10-CM | POA: Insufficient documentation

## 2017-05-23 DIAGNOSIS — E049 Nontoxic goiter, unspecified: Secondary | ICD-10-CM

## 2017-05-23 NOTE — Patient Instructions (Signed)
We will get some blood work today for further evaluation.  I have also ordered a Thyroid ultrasound Your EKG looks fine, the heart doctor will confirm the reading.

## 2017-05-24 LAB — BASIC METABOLIC PANEL
BUN/Creatinine Ratio: 9 (ref 9–23)
BUN: 8 mg/dL (ref 6–20)
CO2: 24 mmol/L (ref 20–29)
Calcium: 9.4 mg/dL (ref 8.7–10.2)
Chloride: 106 mmol/L (ref 96–106)
Creatinine, Ser: 0.87 mg/dL (ref 0.57–1.00)
GFR calc Af Amer: 103 mL/min/{1.73_m2} (ref >59)
GFR calc non Af Amer: 90 mL/min/{1.73_m2} (ref >59)
Glucose: 78 mg/dL (ref 65–99)
Potassium: 4.5 mmol/L (ref 3.5–5.2)
Sodium: 143 mmol/L (ref 134–144)

## 2017-05-24 LAB — CBC
Hematocrit: 38.1 % (ref 34.0–46.6)
Hemoglobin: 13.1 g/dL (ref 11.1–15.9)
MCH: 30.3 pg (ref 26.6–33.0)
MCHC: 34.4 g/dL (ref 31.5–35.7)
MCV: 88 fL (ref 79–97)
Platelets: 286 10*3/uL (ref 150–379)
RBC: 4.33 x10E6/uL (ref 3.77–5.28)
RDW: 12.3 % (ref 12.3–15.4)
WBC: 7.1 10*3/uL (ref 3.4–10.8)

## 2017-05-24 LAB — TSH: TSH: 1.83 u[IU]/mL (ref 0.450–4.500)

## 2017-05-24 LAB — T3, FREE: T3, Free: 2.9 pg/mL (ref 2.0–4.4)

## 2017-05-24 LAB — T4, FREE: Free T4: 1.01 ng/dL (ref 0.82–1.77)

## 2017-05-25 ENCOUNTER — Encounter: Payer: Self-pay | Admitting: Internal Medicine

## 2017-06-12 ENCOUNTER — Telehealth: Payer: Self-pay | Admitting: Internal Medicine

## 2017-06-12 ENCOUNTER — Ambulatory Visit
Admission: RE | Admit: 2017-06-12 | Discharge: 2017-06-12 | Disposition: A | Discharge status: Home / Self Care | Payer: BLUE CROSS/BLUE SHIELD | Source: Ambulatory Visit | Attending: Family Medicine | Admitting: Family Medicine

## 2017-06-12 DIAGNOSIS — E041 Nontoxic single thyroid nodule: Secondary | ICD-10-CM | POA: Diagnosis not present

## 2017-06-12 DIAGNOSIS — E049 Nontoxic goiter, unspecified: Secondary | ICD-10-CM

## 2017-06-12 NOTE — Telephone Encounter (Signed)
Attempted to call patient with lab results. Went to [employer] [facility]. Asked to call back.  Her thyroid gland did not have any suspicious nodules. Additionally her blood work returned all normal.  I would likely recommended holter monitor if she continues to have palpitations.

## 2017-06-13 ENCOUNTER — Telehealth: Payer: Self-pay | Admitting: Internal Medicine

## 2017-06-13 NOTE — Telephone Encounter (Signed)
Discussed unremarkable thyroid ultrasound.  She reports that her palpitations have improved. She notices it when she has anxiety. She thinks she might have heart burn at times. She would like to monitor for now, which I think is reasonable.

## 2017-08-28 ENCOUNTER — Ambulatory Visit (INDEPENDENT_AMBULATORY_CARE_PROVIDER_SITE_OTHER): Payer: BLUE CROSS/BLUE SHIELD

## 2017-08-28 ENCOUNTER — Ambulatory Visit (HOSPITAL_COMMUNITY)
Admission: EM | Admit: 2017-08-28 | Discharge: 2017-08-28 | Disposition: A | Discharge status: Home / Self Care | Payer: BLUE CROSS/BLUE SHIELD | Attending: Family Medicine | Admitting: Family Medicine

## 2017-08-28 ENCOUNTER — Ambulatory Visit (INDEPENDENT_AMBULATORY_CARE_PROVIDER_SITE_OTHER): Payer: BLUE CROSS/BLUE SHIELD | Admitting: Family Medicine

## 2017-08-28 ENCOUNTER — Ambulatory Visit (HOSPITAL_COMMUNITY)
Admission: RE | Admit: 2017-08-28 | Discharge: 2017-08-28 | Disposition: A | Discharge status: Home / Self Care | Payer: BLUE CROSS/BLUE SHIELD | Source: Ambulatory Visit | Attending: Family Medicine | Admitting: Family Medicine

## 2017-08-28 ENCOUNTER — Other Ambulatory Visit: Payer: Self-pay

## 2017-08-28 ENCOUNTER — Encounter (HOSPITAL_COMMUNITY): Payer: Self-pay | Admitting: Emergency Medicine

## 2017-08-28 ENCOUNTER — Encounter: Payer: Self-pay | Admitting: Family Medicine

## 2017-08-28 VITALS — BP 122/80 | HR 114 | Temp 98.4°F | Wt 138.0 lb

## 2017-08-28 DIAGNOSIS — Z833 Family history of diabetes mellitus: Secondary | ICD-10-CM | POA: Diagnosis not present

## 2017-08-28 DIAGNOSIS — R079 Chest pain, unspecified: Secondary | ICD-10-CM

## 2017-08-28 DIAGNOSIS — Z3202 Encounter for pregnancy test, result negative: Secondary | ICD-10-CM

## 2017-08-28 DIAGNOSIS — R1012 Left upper quadrant pain: Secondary | ICD-10-CM | POA: Diagnosis not present

## 2017-08-28 DIAGNOSIS — Z8249 Family history of ischemic heart disease and other diseases of the circulatory system: Secondary | ICD-10-CM | POA: Insufficient documentation

## 2017-08-28 LAB — POCT I-STAT, CHEM 8
BUN: 9 mg/dL (ref 6–20)
Calcium, Ion: 1.24 mmol/L (ref 1.15–1.40)
Chloride: 105 mmol/L (ref 98–111)
Creatinine, Ser: 0.7 mg/dL (ref 0.44–1.00)
Glucose, Bld: 94 mg/dL (ref 70–99)
HCT: 42 % (ref 36.0–46.0)
Hemoglobin: 14.3 g/dL (ref 12.0–15.0)
Potassium: 3.9 mmol/L (ref 3.5–5.1)
Sodium: 140 mmol/L (ref 135–145)
TCO2: 25 mmol/L (ref 22–32)

## 2017-08-28 LAB — CBC WITH DIFFERENTIAL/PLATELET
Abs Immature Granulocytes: 0 10*3/uL (ref 0.0–0.1)
Basophils Absolute: 0.1 10*3/uL (ref 0.0–0.1)
Basophils Relative: 1 %
Eosinophils Absolute: 0.1 10*3/uL (ref 0.0–0.7)
Eosinophils Relative: 1 %
HCT: 42.6 % (ref 36.0–46.0)
Hemoglobin: 14.1 g/dL (ref 12.0–15.0)
Immature Granulocytes: 0 %
Lymphocytes Relative: 19 %
Lymphs Abs: 1.6 10*3/uL (ref 0.7–4.0)
MCH: 30.5 pg (ref 26.0–34.0)
MCHC: 33.1 g/dL (ref 30.0–36.0)
MCV: 92 fL (ref 78.0–100.0)
Monocytes Absolute: 1 10*3/uL (ref 0.1–1.0)
Monocytes Relative: 11 %
Neutro Abs: 5.8 10*3/uL (ref 1.7–7.7)
Neutrophils Relative %: 68 %
Platelets: 260 10*3/uL (ref 150–400)
RBC: 4.63 MIL/uL (ref 3.87–5.11)
RDW: 12 % (ref 11.5–15.5)
WBC: 8.5 10*3/uL (ref 4.0–10.5)

## 2017-08-28 LAB — POCT URINALYSIS DIP (DEVICE)
Bilirubin Urine: NEGATIVE
Glucose, UA: NEGATIVE mg/dL
Ketones, ur: NEGATIVE mg/dL
Leukocytes, UA: NEGATIVE
Nitrite: NEGATIVE
Protein, ur: NEGATIVE mg/dL
Specific Gravity, Urine: 1.01 (ref 1.005–1.030)
Urobilinogen, UA: 0.2 mg/dL (ref 0.0–1.0)
pH: 6 (ref 5.0–8.0)

## 2017-08-28 LAB — POCT PREGNANCY, URINE: Preg Test, Ur: NEGATIVE

## 2017-08-28 NOTE — ED Provider Notes (Addendum)
[facility]    CSN: [id number] Arrival date & time: 08/28/17  0802     History   Chief Complaint Chief Complaint  Patient presents with  . Abdominal Pain    HPI Rebecca Cooley is a 32 y.o. female.   HPI  Patient has a 2-day history of left upper quadrant abdominal pain.  No nausea or vomiting.  No fever.  No food intolerance.  No history of stomach problems ulcers or GERD.  She states that it hurts with a deep breath.  Hurts with movement.  Hurts in her left lower rib cage from through to her back.  She is splinting her left side and is afraid of moving.  It hurts to laugh or cough or sneeze.  No history of cough or chest congestion.  No history of any underlying lung disease, asthma.  No cigarette smoking.  No problems with bowels.  No urinary history.  No trauma to the ribs.  Only thing she did unusual this week is to start a walking program and walk 3 miles.  No pain while she was doing this activity.  She is usually in good health.  She is on no medications.  Past Medical History:  Diagnosis Date  . Thyroid disease     Patient Active Problem List   Diagnosis Date Noted  . Desire for pregnancy 04/28/2017  . 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 History of thyroiditis 01/26/2014    History reviewed. No pertinent surgical history.  OB History    Gravida  1   Para  1   Term      Preterm      AB      Living        SAB      TAB      Ectopic      Multiple      Live Births               Home Medications    Prior to Admission medications   Not on File    Family History Family History  Problem Relation Age of Onset  . Hypertension Mother   . Diabetes Mother   . Dementia Father   . Diabetes Maternal Grandmother     Social History Social History   Tobacco Use  . Smoking status: Never Smoker  . Smokeless tobacco: Never Used  Substance Use Topics  . Alcohol use: Yes    Alcohol/week: 1.2 - 1.8 oz    Types: 2  - 3 Glasses of wine per week    Comment: Every weekday  . Drug use: No     Allergies   Patient has no known allergies.   Review of Systems Review of Systems  Constitutional: Negative for chills, fatigue and fever.  HENT: Negative for congestion, dental problem, ear pain and sore throat.   Eyes: Negative for pain and visual disturbance.  Respiratory: Negative for cough and shortness of breath.   Cardiovascular: Negative for chest pain and palpitations.  Gastrointestinal: Positive for abdominal pain. Negative for nausea and vomiting.  Genitourinary: Negative for difficulty urinating, dysuria, hematuria, vaginal bleeding and vaginal discharge.  Musculoskeletal: Negative for arthralgias and back pain.  Skin: Negative for color change and rash.  Neurological: Negative for seizures, syncope and headaches.  Psychiatric/Behavioral: Negative for sleep disturbance.  All other systems reviewed and are negative.    Physical Exam Triage Vital Signs ED Triage Vitals  Enc Vitals Group  BP 08/28/17 0824 (!) 137/94     Pulse Rate 08/28/17 0824 96     Resp 08/28/17 0824 16     Temp 08/28/17 0824 97.8 F (36.6 C)     Temp Source 08/28/17 0824 Oral     SpO2 08/28/17 0824 99 %     Weight --      Height --      Head Circumference --      Peak Flow --      Pain Score 08/28/17 0835 10     Pain Loc --      Pain Edu? --      Excl. in GC? --    No data found.  Updated Vital Signs BP (!) 137/94 (BP Location: Left Arm)   Pulse 96   Temp 97.8 F (36.6 C) (Oral)   Resp 16   LMP 05/29/2016 Comment: last depo shot was january, 2019  SpO2 99%       Physical Exam  Constitutional: She is oriented to person, place, and time. She appears well-developed and well-nourished. No distress.  HENT:  Head: Normocephalic and atraumatic.  Mouth/Throat: Oropharynx is clear and moist.  Eyes: Pupils are equal, round, and reactive to light. Conjunctivae are normal.  Neck: Normal range of motion.    Cardiovascular: Normal rate, regular rhythm and normal heart sounds.  Pulmonary/Chest: Effort normal and breath sounds normal. No respiratory distress. She has no rales.  Abdominal: Soft. Bowel sounds are normal. She exhibits no distension. There is no splenomegaly or hepatomegaly. There is tenderness in the left upper quadrant. There is no rigidity, no rebound and no guarding.  Musculoskeletal: Normal range of motion. She exhibits no edema.  Neurological: She is alert and oriented to person, place, and time.  Skin: Skin is warm and dry.  Psychiatric: She has a normal mood and affect.  Tenderness to deep palpation in the left upper quadrant.  No splenomegaly.  No midepigastric tenderness.  No chest wall tenderness.  No CVA tenderness.  Examined abdomen a second time after lab work and chest x-ray completed.  Discussed test results.  Tenderness remains in the deep left upper quadrant with no palpable mass or abnormality.   UC Treatments / Results  Labs (all labs ordered are listed, but only abnormal results are displayed) Labs Reviewed  POCT URINALYSIS DIP (DEVICE) - Abnormal; Notable for the following components:      Result Value   Hgb urine dipstick TRACE (*)    All other components within normal limits  CBC WITH DIFFERENTIAL/PLATELET  POCT PREGNANCY, URINE  POCT I-STAT, CHEM 8    EKG None  Radiology Dg Chest 2 View  Result Date: 08/28/2017 CLINICAL DATA:  Left lower chest pain for 2 days. EXAM: CHEST - 2 VIEW COMPARISON:  Two-view chest x-ray a 11/08/2013. FINDINGS: The heart size and mediastinal contours are within normal limits. Both lungs are clear. The visualized skeletal structures are unremarkable. IMPRESSION: Negative two view chest x-ray Electronically Signed   By: Marin Roberts M.D.   On: 08/28/2017 09:42    Procedures Procedures (including critical care time)  Medications Ordered in UC Medications - No data to display  Initial Impression / Assessment and  Plan / UC Course  I have reviewed the triage vital signs and the nursing notes.  Pertinent labs & imaging results that were available during my care of the patient were reviewed by me and considered in my medical decision making (see chart for details).  Discussed with patient that all of her testing is negative.  I do not have a reason for her left upper quadrant pain.  She states this came on the day after she had done some walking which is an unusual activity for her.  There exists the possibility is musculoskeletal.  I do not see anything intra-abdominal going on.  I do not find anything dangerous.  We will get a send her home with rest and ibuprofen.  She is strict instructions if she gets worse instead of better or develops new symptoms she would need to go the emergency room or to her primary care physician. Greater than 50% of this visit was spent in counseling and coordinating care.  Total face to face time:   40 min discussing the differential diagnosis of left upper quadrant pain, work-up, diagnosis, follow-up Final Clinical Impressions(s) / UC Diagnoses   Final diagnoses:  Left upper quadrant pain     Discharge Instructions     Drink plenty of fluids Take ibuprofen 800 mg 3 times a day with food Physical activity as tolerated.  Avoid movement that is painful.  See your primary care doctor or the emergency room if your pain worsens, you develop a fever, or develop new symptoms like nausea or vomiting. Your chest x-ray and lab work today are all normal These results are available to your PCP    ED Prescriptions    None     Controlled Substance Prescriptions [place] Controlled Substance Registry consulted? Not Applicable   Eustace Moore, MD 08/28/17 1041    Eustace Moore, MD 08/28/17 1043

## 2017-08-28 NOTE — Discharge Instructions (Signed)
Drink plenty of fluids Take ibuprofen 800 mg 3 times a day with food Physical activity as tolerated.  Avoid movement that is painful.  See your primary care doctor or the emergency room if your pain worsens, you develop a fever, or develop new symptoms like nausea or vomiting. Your chest x-ray and lab work today are all normal These results are available to your PCP

## 2017-08-28 NOTE — Progress Notes (Signed)
Subjective:  Rebecca Cooley is a 32 y.o. female who [facility] with a chief complaint of L rib pain.   HPI:  Patient has had 2 days of left-sided chest pain over her left lateral ribs.  She was seen for this in urgent care earlier today.  She says that this started after a leisurely walk.  No trauma.  Not associated with any shortness of breath or nausea or diaphoresis.  She says that it mostly on her left lateral rib cage, does not radiate, is associated with positional changes particularly lying on that side or leaning forward.  She has never had anything like this before.  She has had no recent illnesses.  She has no abdominal pain or heartburn or indigestion.  She does not have any heart fluttering or palpitations, does feel a throbbing and the pain is bad.  She says that her pain is most relieved with 600 mg of ibuprofen which she took earlier today but that when she has not taken ibuprofen and it is been a stabbing pain.  ROS: Per HPI  Objective:  Physical Exam: BP 122/80   Pulse (!) 114   Temp 98.4 F (36.9 C) (Oral)   Wt 138 lb (62.6 kg)   LMP 05/29/2016 Comment: stopped depo in January 2019, no cycle since  SpO2 98%   BMI 26.07 kg/m  HR recheck 98.  Gen: NAD, resting comfortably CV: RRR with no murmurs appreciated.  No friction rub even with leaning on left side or leaning forward. Pulm: NWOB, CTAB with no crackles, wheezes, or rhonchi GI: Normal bowel sounds present. Soft, Nontender, Nondistended. MSK: no edema, cyanosis, or clubbing noted Skin: warm, dry Neuro: grossly normal, moves all extremities Psych: Normal affect and thought content  Results for orders placed or performed during the hospital encounter of 08/28/17 (from the past 72 hour(s))  POCT urinalysis dip (device)     Status: Abnormal   Collection Time: 08/28/17  8:43 AM  Result Value Ref Range   Glucose, UA NEGATIVE NEGATIVE mg/dL   Bilirubin Urine NEGATIVE NEGATIVE   Ketones, ur  NEGATIVE NEGATIVE mg/dL   Specific Gravity, Urine 1.010 1.005 - 1.030   Hgb urine dipstick TRACE (A) NEGATIVE   pH 6.0 5.0 - 8.0   Protein, ur NEGATIVE NEGATIVE mg/dL   Urobilinogen, UA 0.2 0.0 - 1.0 mg/dL   Nitrite NEGATIVE NEGATIVE   Leukocytes, UA NEGATIVE NEGATIVE    Comment: Biochemical Testing Only. Please order routine urinalysis from main lab if confirmatory testing is needed.  Pregnancy, urine POC     Status: None   Collection Time: 08/28/17  8:52 AM  Result Value Ref Range   Preg Test, Ur NEGATIVE NEGATIVE    Comment:        THE SENSITIVITY OF THIS METHODOLOGY IS >24 mIU/mL   CBC with Differential     Status: None   Collection Time: 08/28/17  9:01 AM  Result Value Ref Range   WBC 8.5 4.0 - 10.5 K/uL   RBC 4.63 3.87 - 5.11 MIL/uL   Hemoglobin 14.1 12.0 - 15.0 g/dL   HCT 16.142.6 09.636.0 - 04.546.0 %   MCV 92.0 78.0 - 100.0 fL   MCH 30.5 26.0 - 34.0 pg   MCHC 33.1 30.0 - 36.0 g/dL   RDW 40.912.0 81.111.5 - 91.415.5 %   Platelets 260 150 - 400 K/uL   Neutrophils Relative % 68 %   Neutro Abs 5.8 1.7 - 7.7 K/uL  Lymphocytes Relative 19 %   Lymphs Abs 1.6 0.7 - 4.0 K/uL   Monocytes Relative 11 %   Monocytes Absolute 1.0 0.1 - 1.0 K/uL   Eosinophils Relative 1 %   Eosinophils Absolute 0.1 0.0 - 0.7 K/uL   Basophils Relative 1 %   Basophils Absolute 0.1 0.0 - 0.1 K/uL   Immature Granulocytes 0 %   Abs Immature Granulocytes 0.0 0.0 - 0.1 K/uL    Comment: Performed at [facility], [address] [place], [place] [address]  I-STAT, [place]     Status: None   Collection Time: 08/28/17  9:27 AM  Result Value Ref Range   Sodium 140 135 - 145 mmol/L   Potassium 3.9 3.5 - 5.1 mmol/L   Chloride 105 98 - 111 mmol/L   BUN 9 6 - 20 mg/dL   Creatinine, Ser 6.04 0.44 - 1.00 mg/dL   Glucose, Bld 94 70 - 99 mg/dL   Calcium, Ion 5.40 9.81 - 1.40 mmol/L   TCO2 25 22 - 32 mmol/L   Hemoglobin 14.3 12.0 - 15.0 g/dL   HCT 19.1 47.8 - 29.5 %   EKG: NSR, HR 98  Assessment/Plan:  Chest  pain Most likely MSK related chest pain given exacerbated with positional changes however is not reproducible on exam. DDx include pericarditis but is worse with leaning forward and no friction rub on exam, in addition no systemic symptoms. EKG is normal sinus rhythm which is reassuring. Patient is well appearing and was seen for this in urgent care earlier today with normal CXR and labs. Discussed symptomatic management at home with NSAIDs. Discussed return precautions including acute worsening, SOB, diaphoresis, presyncope or syncope.   Leland Her, DO PGY-3, [facility] 08/28/2017 4:15 PM

## 2017-08-28 NOTE — ED Triage Notes (Signed)
2 day history of abdominal pain.  Pain worsened yesterday.  Describes pain as sharp, left side of torso, radiating into back.  Denies nausea and vomiting.  Last bm was last night and normal per patient.  Denies falls.  Denies uri recently.

## 2017-08-28 NOTE — ED Notes (Signed)
Patient and husband at discharge both have many questions.  Offered to have Fabian Coca return to discuss questions.

## 2017-08-28 NOTE — Patient Instructions (Addendum)
Your EKG looks good.  Treat like muscukoskeletal chest wall pain with ibuprofen.  Musculoskeletal Pain Musculoskeletal pain is muscle and bone aches and pains. This pain can occur in any part of the body. Follow these instructions at home:  Only take medicines for pain, discomfort, or fever as told by your health care provider.  You may continue all activities unless the activities cause more pain. When the pain lessens, slowly resume normal activities. Gradually increase the intensity and duration of the activities or exercise.  During periods of severe pain, bed rest may be helpful. Lie or sit in any position that is comfortable, but get out of bed and walk around at least every several hours.  If directed, put ice on the injured area. ? Put ice in a plastic bag. ? Place a towel between your skin and the bag. ? Leave the ice on for 20 minutes, 2-3 times a day. Contact a health care provider if:  Your pain is getting worse.  Your pain is not relieved with medicines.  You lose function in the area of the pain if the pain is in your arms, legs, or neck. This information is not intended to replace advice given to you by your health care provider. Make sure you discuss any questions you have with your health care provider. Document Released: 01/28/2005 Document Revised: 07/11/2015 Document Reviewed: 10/02/2012 Elsevier Interactive Patient Education  2017 [employer]

## 2017-08-29 DIAGNOSIS — R079 Chest pain, unspecified: Secondary | ICD-10-CM | POA: Insufficient documentation

## 2017-08-29 HISTORY — DX: Chest pain, unspecified: R07.9

## 2017-08-29 NOTE — Assessment & Plan Note (Signed)
Most likely MSK related chest pain given exacerbated with positional changes however is not reproducible on exam. DDx include pericarditis but is worse with leaning forward and no friction rub on exam, in addition no systemic symptoms. EKG is normal sinus rhythm which is reassuring. Patient is well appearing and was seen for this in urgent care earlier today with normal CXR and labs. Discussed symptomatic management at home with NSAIDs. Discussed return precautions including acute worsening, SOB, diaphoresis, presyncope or syncope.

## 2018-01-25 ENCOUNTER — Encounter (HOSPITAL_COMMUNITY): Payer: Self-pay

## 2018-01-25 ENCOUNTER — Ambulatory Visit (HOSPITAL_COMMUNITY)
Admission: EM | Admit: 2018-01-25 | Discharge: 2018-01-25 | Disposition: A | Discharge status: Home / Self Care | Payer: BLUE CROSS/BLUE SHIELD | Attending: Family | Admitting: Family

## 2018-01-25 ENCOUNTER — Other Ambulatory Visit: Payer: Self-pay

## 2018-01-25 DIAGNOSIS — R05 Cough: Secondary | ICD-10-CM | POA: Diagnosis not present

## 2018-01-25 DIAGNOSIS — R509 Fever, unspecified: Secondary | ICD-10-CM | POA: Insufficient documentation

## 2018-01-25 DIAGNOSIS — J029 Acute pharyngitis, unspecified: Secondary | ICD-10-CM | POA: Diagnosis not present

## 2018-01-25 DIAGNOSIS — J111 Influenza due to unidentified influenza virus with other respiratory manifestations: Secondary | ICD-10-CM | POA: Diagnosis not present

## 2018-01-25 NOTE — ED Triage Notes (Addendum)
Pt presents today with productive cough, body aches, chills, sore throat not sure if she has had a fever but states she felt like she has and nasal congestion. Has tried OTC mucinex, alka-seltzer- and theraflu with no relief.

## 2018-01-25 NOTE — ED Provider Notes (Signed)
[facility]    CSN: [id number] Arrival date & time: 01/25/18  1239     History   Chief Complaint Chief Complaint  Patient presents with  . Cough    HPI Felise Georgia is a 32 y.o. female.   32 year old female presents with fever, chills, body aches, cough, headache, and nasal congestion for the past 3 to 4 days. Also having a severe sore throat and has vomited due to cough. Has taken OTC Mucinex, Theraflu and Alka-Seltzer with minimal relief. Other co-workers ill. Did not have flu shot this year. No other chronic health issues. Takes no daily medication.   The history is provided by the patient.    Past Medical History:  Diagnosis Date  . Thyroid disease     Patient Active Problem List   Diagnosis Date Noted  . Chest pain 08/29/2017  . Desire for pregnancy 04/28/2017  . 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 History of thyroiditis 01/26/2014    History reviewed. No pertinent surgical history.  OB History    Gravida  1   Para  1   Term      Preterm      AB      Living        SAB      TAB      Ectopic      Multiple      Live Births               Home Medications    Prior to Admission medications   Not on File    Family History Family History  Problem Relation Age of Onset  . Hypertension Mother   . Diabetes Mother   . Dementia Father   . Diabetes Maternal Grandmother     Social History Social History   Tobacco Use  . Smoking status: Never Smoker  . Smokeless tobacco: Never Used  Substance Use Topics  . Alcohol use: Yes    Alcohol/week: 2.0 - 3.0 standard drinks    Types: 2 - 3 Glasses of wine per week    Comment: Every weekday  . Drug use: No     Allergies   Patient has no known allergies.   Review of Systems Review of Systems  Constitutional: Positive for appetite change, chills, fatigue and fever.  HENT: Positive for congestion, postnasal drip, rhinorrhea, sinus pressure, sinus pain  and sore throat. Negative for ear discharge, ear pain, mouth sores, nosebleeds, sneezing and trouble swallowing.   Eyes: Negative for pain, discharge, redness and itching.  Respiratory: Positive for cough. Negative for chest tightness, shortness of breath and wheezing.   Gastrointestinal: Positive for nausea and vomiting (due to cough). Negative for abdominal pain and diarrhea.  Musculoskeletal: Positive for arthralgias and myalgias. Negative for back pain, neck pain and neck stiffness.  Skin: Negative for color change, rash and wound.  Neurological: Positive for headaches. Negative for dizziness, tremors, seizures, syncope, weakness, light-headedness and numbness.  Hematological: Negative for adenopathy. Does not bruise/bleed easily.     Physical Exam Triage Vital Signs ED Triage Vitals  Enc Vitals Group     BP 01/25/18 1403 122/79     Pulse Rate 01/25/18 1403 (!) 101     Resp 01/25/18 1403 16     Temp 01/25/18 1403 99.6 F (37.6 C)     Temp Source 01/25/18 1403 Oral     SpO2 01/25/18 1403 100 %     Weight --  Height --      Head Circumference --      Peak Flow --      Pain Score 01/25/18 1404 6     Pain Loc --      Pain Edu? --      Excl. in GC? --    No data found.  Updated Vital Signs BP 122/79 (BP Location: Right Arm)   Pulse (!) 101   Temp 99.6 F (37.6 C) (Oral)   Resp 16   SpO2 100%   Visual Acuity Right Eye Distance:   Left Eye Distance:   Bilateral Distance:    Right Eye Near:   Left Eye Near:    Bilateral Near:     Physical Exam Vitals signs and nursing note reviewed.  Constitutional:      General: She is not in acute distress.    Appearance: She is well-developed, well-groomed and normal weight. She is ill-appearing.     Comments: Patient sitting on exam table in no acute distress but appears ill.   HENT:     Head: Normocephalic and atraumatic.     Right Ear: Hearing, tympanic membrane, ear canal and external ear normal.     Left Ear:  Hearing, tympanic membrane, ear canal and external ear normal.     Nose: Rhinorrhea present. No congestion. Rhinorrhea is clear.     Right Turbinates: Not enlarged or swollen.     Left Turbinates: Not enlarged or swollen.     Right Sinus: Maxillary sinus tenderness present. No frontal sinus tenderness.     Left Sinus: Maxillary sinus tenderness present. No frontal sinus tenderness.     Mouth/Throat:     Lips: Pink.     Mouth: Mucous membranes are moist.     Pharynx: Pharyngeal swelling (slight) and posterior oropharyngeal erythema present. No oropharyngeal exudate.  Eyes:     Extraocular Movements: Extraocular movements intact.     Conjunctiva/sclera: Conjunctivae normal.  Neck:     Musculoskeletal: Normal range of motion and neck supple.  Cardiovascular:     Rate and Rhythm: Regular rhythm. Tachycardia present.     Pulses: Normal pulses.     Heart sounds: Normal heart sounds.  Pulmonary:     Effort: Pulmonary effort is normal. No respiratory distress.     Breath sounds: Normal air entry. Examination of the right-upper field reveals decreased breath sounds. Examination of the left-upper field reveals decreased breath sounds. Examination of the right-lower field reveals decreased breath sounds. Examination of the left-lower field reveals decreased breath sounds. Decreased breath sounds present. No wheezing, rhonchi or rales.  Lymphadenopathy:     Head:     Right side of head: Tonsillar adenopathy present.     Left side of head: Tonsillar adenopathy present.     Cervical: Cervical adenopathy present.     Right cervical: Superficial cervical adenopathy present.     Left cervical: Superficial cervical adenopathy present.  Skin:    General: Skin is warm and dry.     Capillary Refill: Capillary refill takes less than 2 seconds.     Findings: No rash.  Neurological:     General: No focal deficit present.     Mental Status: She is alert and oriented to person, place, and time.    Psychiatric:        Mood and Affect: Mood normal.        Behavior: Behavior normal. Behavior is cooperative.        Thought Content: Thought content  normal.        Judgment: Judgment normal.      UC Treatments / Results  Labs (all labs ordered are listed, but only abnormal results are displayed) Labs Reviewed  CULTURE, GROUP A STREP [facility])  POCT RAPID STREP A    EKG None  Radiology No results found.  Procedures Procedures (including critical care time)  Medications Ordered in UC Medications - No data to display  Initial Impression / Assessment and Plan / UC Course  I have reviewed the triage vital signs and the nursing notes.  Pertinent labs & imaging results that were available during my care of the patient were reviewed by me and considered in my medical decision making (see chart for details).    Reviewed negative rapid strep test with patient. Discussed that she probably has influenza. Discussed that she is beyond ideal treatment time for anti-viral medications. Offered to prescribe Rx strength cough medication or anti-nausea medication but patient declined. Will continue OTC Theraflu as directed. Discussed alternating Tylenol and Ibuprofen as directed to help with fever. Continue to push fluids. Note written for work. Follow-up in 3 to 4 days if not improving and pending throat culture results.   Final Clinical Impressions(s) / UC Diagnoses   Final diagnoses:  Influenza  Acute sore throat     Discharge Instructions     Recommend continue Ibuprofen and Tylenol as needed for fever and body aches. May continue Theraflu as directed. Continue to push fluids. Rest. Follow-up in 3 to 4 days if not improving and pending throat culture results.     ED Prescriptions    None     Controlled Substance Prescriptions [place] Controlled Substance Registry consulted? Not Applicable   Sudie Grumbling, NP 01/26/18 1222

## 2018-01-25 NOTE — Discharge Instructions (Signed)
Recommend continue Ibuprofen and Tylenol as needed for fever and body aches. May continue Theraflu as directed. Continue to push fluids. Rest. Follow-up in 3 to 4 days if not improving and pending throat culture results.

## 2018-01-26 LAB — POCT RAPID STREP A: Streptococcus, Group A Screen (Direct): NEGATIVE

## 2018-01-27 LAB — CULTURE, GROUP A STREP (THRC): Special Requests: NORMAL

## 2018-04-14 DIAGNOSIS — J208 Acute bronchitis due to other specified organisms: Secondary | ICD-10-CM | POA: Diagnosis not present

## 2018-07-01 ENCOUNTER — Encounter: Payer: Self-pay | Admitting: Family Medicine

## 2018-07-01 ENCOUNTER — Ambulatory Visit: Payer: BLUE CROSS/BLUE SHIELD | Admitting: Family Medicine

## 2018-07-01 ENCOUNTER — Other Ambulatory Visit: Payer: Self-pay

## 2018-07-01 VITALS — BP 108/69 | HR 128 | Wt 127.0 lb

## 2018-07-01 DIAGNOSIS — R5383 Other fatigue: Secondary | ICD-10-CM

## 2018-07-01 DIAGNOSIS — Z8639 Personal history of other endocrine, nutritional and metabolic disease: Secondary | ICD-10-CM

## 2018-07-01 LAB — POCT HEMOGLOBIN: Hemoglobin: 13.1 g/dL (ref 11–14.6)

## 2018-07-01 NOTE — Progress Notes (Signed)
Subjective:  Patient ID: Rebecca Cooley  DOB: 11-Sep-1985 MRN: [id number]  Rebecca Cooley is a 33 y.o. female with a PMH of Hashimoto's thyroiditis, here today for fatigue.   HPI:  Fatigue: -Patient reports very for the past few months that she has had increasing fatigue as well as "brain fogginess". -Reports that she does not feel the need to get up out of bed most days.  States that she has also lost an appetite.  Patient reports that this has worsened since she has been having to work at home and while her daughter has been homeschooled secondary to COVID-19 pandemic. -Patient reports that she is often trouble because she does not know if this is secondary to her thyroid or she is concerned that she may develop dementia as her dad has. -Patient when asked if she thinks this could be secondary to depression becomes very tearful and states that she thinks that this could be part of that but it is very hard to know and she is worried that it is something else going on in her body. -States that she has done some research regarding thyroid and would like to start medication even if her levels are normal. -Patient denies any SI/HI.  Denies any recent fever or chills.  Denies any recent cough.  Denies any recent shortness of breath or chest pain. -Patient does note that while she is sitting at home she has experienced chest palpitations multiple times that will then cause her to feel more anxious and like the world is closing in on her.  States that when she starts doing deep breathing exercises and she is able to come out of this somewhat. -While she has been at home she has been trying to keep herself busy with activities such as gardening.  ROS: All other systems otherwise negative, except as mentioned in HPI  Family hx: Father with dementia  Social hx: Denies use of illicit drugs, alcohol use Smoking status reviewed  Patient Active Problem List   Diagnosis Date Noted  . Fatigue 07/02/2018   . Desire for pregnancy 04/28/2017  . History of thyroiditis 01/26/2014     Objective:  BP 108/69   Pulse (!) 128   Wt 127 lb (57.6 kg)   LMP 06/16/2018   SpO2 97%   BMI 24.00 kg/m   Vitals and nursing note reviewed  General: NAD, pleasant Pulm: normal effort Extremities: no edema or cyanosis. WWP. Skin: warm and dry, no rashes noted Neuro: alert and oriented, no focal deficits Psych: normal affect, normal thought content, tearful, normal speech  Depression screen [facility] 2/9 07/01/2018 07/01/2018 08/28/2017  Decreased Interest 3 0 0  Down, Depressed, Hopeless 2 0 0  PHQ - 2 Score 5 0 0  Altered sleeping 3 - -  Tired, decreased energy 3 - -  Change in appetite 1 - -  Feeling bad or failure about yourself  2 - -  Trouble concentrating 1 - -  Moving slowly or fidgety/restless 0 - -  Suicidal thoughts 0 - -  PHQ-9 Score 15 - -  Difficult doing work/chores Somewhat difficult - -   Assessment & Plan:   History of thyroiditis Patient would like to have her TSH evaluated again given increased fatigue as well as heart palpitations.  Last TSH obtained in 2019 was WNL.  Will check TSH, free T4 and free T3.   Thyroid ultrasound from 06/2017 showed: 1. Diffusely heterogeneous thyroid gland which measures slightly larger on the right compared to 04/14/2013.  Sonographic features are consistent with the clinical history of Hashimoto's thyroiditis. 2. No discrete thyroid nodules. 3. Mildly prominent but not enlarged by imaging criteria lymph nodes noted inferior to the left thyroid gland. These are likely reactive.  Patient's TFT's wnl, will not initiate treatment. For tachycardia, she had normal EKG in 2019 when also noted to be tachycardic and is without SOB, chest pain.  Patient does not have any enlarging goiter noted today.  Fatigue Patient tearful today on exam with PHQ 9 score of 15.  Point-of-care hemoglobin within normal limits.   -Will obtain anemia panel as patient is  requesting her vitamin B12 level as well as iron level. -We will also obtain TFTs as above. -Patient would also like information regarding resources in the community for someone to talk to.  Will forward this note to Gavin Pound to reach out to patient and give her more information. -We will call patient with her results and we will discuss plans at that time regarding whether or not she would like to start medication or simply talk with someone regarding her PHQ 9 results. -All of this likely exacerbated by recent COVID-19 pandemic and patient being unable to leave her home. -Given handout on 10 little things to do while she is at home and encouraged to go out to the sun as much as she is able. -Patient currently with no SI/HI and is to reach out if she experiences any changes in given number to call.   [place] Shirley, DO Family Medicine Resident PGY-2

## 2018-07-01 NOTE — Patient Instructions (Signed)
Thank you for coming to see me today. It was a pleasure! Today we talked about:   Your fatigue.  I will call you with your lab results which may not result until Monday. Your blood count is normal, showing no signs of anemia. Our social worker Gavin PoundDeborah will be reaching out to you regarding your mood. In the meantime do some small things in order to help increase your mood like continuing to garden.  Feel free to reach out or call our office at any time.  If you are having any thoughts of hurting yourself then please call 911.  Please follow-up with me as needed.  If you have any questions or concerns, please do not hesitate to call the office at [phone].  Take Care,   [place] Shirley, DO  10 LITTLE Things To Do When You're Feeling Too Down To Do Anything  Take a shower. Even if you plan to stay in all day long and not see a soul, take a shower. It takes the most effort to hop in to the shower but once you do, you'll feel immediate results. It will wake you up and you'll be feeling much fresher (and cleaner too).  Brush and floss your teeth. Give your teeth a good brushing with a floss finish. It's a small task but it feels so good and you can check 'taking care of your health' off the list of things to do.  Do something small on your list. Most of us have some small thing we would like to get done (load of laundry, sew a button, email a friend). Doing one of these things will make you feel like you've accomplished something.  Drink water. Drinking water is easy right? It's also really beneficial for your health so keep a glass beside you all day and take sips often. It gives you energy and prevents you from boredom eating.  Do some floor exercises. The last thing you want to do is exercise but it might be just the thing you need the most. Keep it simple and do exercises that involve sitting or laying on the floor. Even the smallest of exercises release chemicals in the brain that  make you feel good. Yoga stretches or core exercises are going to make you feel good with minimal effort.  Make your bed. Making your bed takes a few minutes but it's productive and you'll feel relieved when it's done. An unmade bed is a huge visual reminder that you're having an unproductive day. Do it and consider it your housework for the day.  Put on some nice clothes. Take the sweatpants off even if you don't plan to go anywhere. Put on clothes that make you feel good. Take a look in the mirror so your brain recognizes the sweatpants have been replaced with clothes that make you look great. It's an instant confidence booster.  Wash the dishes. A pile of dirty dishes in the sink is a reflection of your mood. It's possible that if you wash up the dishes, your mood will follow suit. It's worth a try.  Cook a real meal. If you have the luxury to have a "do nothing" day, you have time to make a real meal for yourself. Make a meal that you love to eat. The process is good to get you out of the funk and the food will ensure you have more energy for tomorrow.  Write out your thoughts by hand. When you hand write, you stimulate your  brain to focus on the moment that you're in so make yourself comfortable and write whatever comes into your mind. Put those thoughts out on paper so they stop spinning around in your head. Those thoughts might be the very thing holding you down.

## 2018-07-02 ENCOUNTER — Encounter: Payer: Self-pay | Admitting: Family Medicine

## 2018-07-02 ENCOUNTER — Telehealth: Payer: Self-pay | Admitting: Licensed Clinical Social Worker

## 2018-07-02 DIAGNOSIS — R5383 Other fatigue: Secondary | ICD-10-CM | POA: Insufficient documentation

## 2018-07-02 LAB — ANEMIA PANEL
Ferritin: 124 ng/mL (ref 15–150)
Folate, Hemolysate: 231 ng/mL
Folate, RBC: 608 ng/mL (ref >498)
Hematocrit: 38 % (ref 34.0–46.6)
Iron Saturation: 44 % (ref 15–55)
Iron: 135 ug/dL (ref 27–159)
Retic Ct Pct: 1.7 % (ref 0.6–2.6)
Total Iron Binding Capacity: 309 ug/dL (ref 250–450)
UIBC: 174 ug/dL (ref 131–425)
Vitamin B-12: 679 pg/mL (ref 232–1245)

## 2018-07-02 LAB — T4, FREE: Free T4: 1.01 ng/dL (ref 0.82–1.77)

## 2018-07-02 LAB — TSH: TSH: 2.09 u[IU]/mL (ref 0.450–4.500)

## 2018-07-02 LAB — T3, FREE: T3, Free: 2.8 pg/mL (ref 2.0–4.4)

## 2018-07-02 NOTE — Assessment & Plan Note (Addendum)
Patient tearful today on exam with PHQ 9 score of 15.  Point-of-care hemoglobin within normal limits.   -Will obtain anemia panel as patient is requesting her vitamin B12 level as well as iron level. -TFT's wnl. -Patient would also like information regarding resources in the community for someone to talk to.  Will forward this note to Gavin Pound to reach out to patient and give her more information. -We will call patient with her results and we will discuss plans at that time regarding whether or not she would like to start medication or simply talk with someone regarding her PHQ 9 results. -All of this likely exacerbated by recent COVID-19 pandemic and patient being unable to leave her home. -Given handout on 10 little things to do while she is at home and encouraged to go out to the sun as much as she is able. -Patient currently with no SI/HI and is to reach out if she experiences any changes in given number to call.

## 2018-07-02 NOTE — Telephone Encounter (Signed)
   Phone Outreach Note  07/02/2018 Name: Rebecca Cooley MRN: [id number] DOB: [birth year]  Referred by: Shirley, [place], DO Reason for referral : Care Coordination (elevated PHQ-9)   An unsuccessful telephone outreach to patient was attempted today ref the above consult.  Left voice message to call LCSW with best day and time for phone appointment.  Follow Up Plan:  If no return call is received. LCSW will call again in 1 week. Dr. Talbert Forest has been notified of this outreach.  Sammuel Hines, LCSW [facility]   [phone] 4:32 PM

## 2018-07-02 NOTE — Assessment & Plan Note (Addendum)
Patient would like to have her TSH evaluated again given increased fatigue as well as heart palpitations.  Last TSH obtained in 2019 was WNL.  Will check TSH, free T4 and free T3.   Thyroid ultrasound from 06/2017 showed: 1. Diffusely heterogeneous thyroid gland which measures slightly larger on the right compared to 04/14/2013. Sonographic features are consistent with the clinical history of Hashimoto's thyroiditis. 2. No discrete thyroid nodules. 3. Mildly prominent but not enlarged by imaging criteria lymph nodes noted inferior to the left thyroid gland. These are likely reactive.  Patient's TFT's wnl, will not initiate treatment. For tachycardia, she had normal EKG in 2019 when also noted to be tachycardic and is without SOB, chest pain.  Patient does not have any enlarging goiter noted today.

## 2018-07-10 ENCOUNTER — Telehealth: Payer: Self-pay | Admitting: Licensed Clinical Social Worker

## 2018-07-10 NOTE — Telephone Encounter (Signed)
   Phone Outreach Note  07/10/2018 Name: Rebecca Cooley MRN: [id number] DOB: 1985/12/01  Referred by: Shirley, [place], DO Reason for referral : Care Coordination (counseling resources)   2nd unsuccessful telephone outreach attempt to Ms. [name] today. Left voice message to call LCSW.  Plan: LCSW will wait for return call, if no return call is received, will reach out to Ms. [name] again over the next 7 to 10 days. If unable to reach Ms. [name] by phone on the 3rd attempt, will discontinue outreach calls but will be available at any time to provide services to Ms. [name].   Sammuel Hines, LCSW [facility]   [phone] 11:43 AM

## 2018-07-24 NOTE — Telephone Encounter (Signed)
   Phone Outreach Note  07/24/2018 Name: Taneesha Edgin MRN: [id number] DOB: November 15, 1985  Referred by: Shirley, [place], DO Reason for referral : Care Coordination (counseling resources)  3rd unsuccessful telephone outreach attempt to Ms. Towana Stenglein today.  Plan: LCSW will discontinue outreach calls but will gladly be available at any time to provide services to Ms. Blimy Napoleon.  Dr. Enid Derry has been notified of this outreach and plan.  Casimer Lanius, LCSW [facility]   [phone] 10:50 AM

## 2018-08-12 DIAGNOSIS — Z6823 Body mass index (BMI) 23.0-23.9, adult: Secondary | ICD-10-CM | POA: Diagnosis not present

## 2018-08-12 DIAGNOSIS — Z124 Encounter for screening for malignant neoplasm of cervix: Secondary | ICD-10-CM | POA: Diagnosis not present

## 2018-08-12 DIAGNOSIS — Z01419 Encounter for gynecological examination (general) (routine) without abnormal findings: Secondary | ICD-10-CM | POA: Diagnosis not present

## 2019-11-24 DIAGNOSIS — Z1151 Encounter for screening for human papillomavirus (HPV): Secondary | ICD-10-CM | POA: Diagnosis not present

## 2019-11-24 DIAGNOSIS — Z01419 Encounter for gynecological examination (general) (routine) without abnormal findings: Secondary | ICD-10-CM | POA: Diagnosis not present

## 2019-11-24 DIAGNOSIS — Z6821 Body mass index (BMI) 21.0-21.9, adult: Secondary | ICD-10-CM | POA: Diagnosis not present

## 2019-12-08 DIAGNOSIS — Z3689 Encounter for other specified antenatal screening: Secondary | ICD-10-CM | POA: Diagnosis not present

## 2019-12-08 DIAGNOSIS — Z3200 Encounter for pregnancy test, result unknown: Secondary | ICD-10-CM | POA: Diagnosis not present

## 2019-12-27 DIAGNOSIS — Z3202 Encounter for pregnancy test, result negative: Secondary | ICD-10-CM | POA: Diagnosis not present

## 2020-01-11 DIAGNOSIS — Z3481 Encounter for supervision of other normal pregnancy, first trimester: Secondary | ICD-10-CM | POA: Diagnosis not present

## 2020-01-11 DIAGNOSIS — Z3689 Encounter for other specified antenatal screening: Secondary | ICD-10-CM | POA: Diagnosis not present

## 2020-01-11 LAB — OB RESULTS CONSOLE HIV ANTIBODY (ROUTINE TESTING): HIV: NONREACTIVE

## 2020-01-11 LAB — OB RESULTS CONSOLE RUBELLA ANTIBODY, IGM: Rubella: IMMUNE

## 2020-01-11 LAB — OB RESULTS CONSOLE HEPATITIS B SURFACE ANTIGEN: Hepatitis B Surface Ag: NEGATIVE

## 2020-01-11 LAB — OB RESULTS CONSOLE RPR: RPR: NONREACTIVE

## 2020-02-02 LAB — OB RESULTS CONSOLE GC/CHLAMYDIA
Chlamydia: NEGATIVE
Gonorrhea: NEGATIVE

## 2020-02-22 DIAGNOSIS — Z361 Encounter for antenatal screening for raised alphafetoprotein level: Secondary | ICD-10-CM | POA: Diagnosis not present

## 2020-03-14 DIAGNOSIS — O99282 Endocrine, nutritional and metabolic diseases complicating pregnancy, second trimester: Secondary | ICD-10-CM | POA: Diagnosis not present

## 2020-03-14 DIAGNOSIS — E063 Autoimmune thyroiditis: Secondary | ICD-10-CM | POA: Diagnosis not present

## 2020-03-14 DIAGNOSIS — Z3A19 19 weeks gestation of pregnancy: Secondary | ICD-10-CM | POA: Diagnosis not present

## 2020-03-14 DIAGNOSIS — Z363 Encounter for antenatal screening for malformations: Secondary | ICD-10-CM | POA: Diagnosis not present

## 2020-04-07 DIAGNOSIS — O352XX0 Maternal care for (suspected) hereditary disease in fetus, not applicable or unspecified: Secondary | ICD-10-CM | POA: Diagnosis not present

## 2020-04-07 DIAGNOSIS — Z8279 Family history of other congenital malformations, deformations and chromosomal abnormalities: Secondary | ICD-10-CM | POA: Diagnosis not present

## 2020-05-05 ENCOUNTER — Inpatient Hospital Stay (HOSPITAL_COMMUNITY)
Admission: AD | Admit: 2020-05-05 | Discharge: 2020-05-05 | Disposition: A | Discharge status: Home / Self Care | Payer: BC Managed Care – PPO | Attending: Obstetrics & Gynecology | Admitting: Obstetrics & Gynecology

## 2020-05-05 ENCOUNTER — Inpatient Hospital Stay (HOSPITAL_BASED_OUTPATIENT_CLINIC_OR_DEPARTMENT_OTHER): Payer: BC Managed Care – PPO

## 2020-05-05 ENCOUNTER — Other Ambulatory Visit: Payer: Self-pay

## 2020-05-05 ENCOUNTER — Encounter (HOSPITAL_COMMUNITY): Payer: Self-pay | Admitting: Obstetrics & Gynecology

## 2020-05-05 DIAGNOSIS — Z3A27 27 weeks gestation of pregnancy: Secondary | ICD-10-CM

## 2020-05-05 DIAGNOSIS — O4692 Antepartum hemorrhage, unspecified, second trimester: Secondary | ICD-10-CM

## 2020-05-05 DIAGNOSIS — Z3689 Encounter for other specified antenatal screening: Secondary | ICD-10-CM

## 2020-05-05 LAB — URINALYSIS, ROUTINE W REFLEX MICROSCOPIC
Bacteria, UA: NONE SEEN
Bilirubin Urine: NEGATIVE
Glucose, UA: NEGATIVE mg/dL
Ketones, ur: NEGATIVE mg/dL
Leukocytes,Ua: NEGATIVE
Nitrite: NEGATIVE
Protein, ur: NEGATIVE mg/dL
Specific Gravity, Urine: 1.014 (ref 1.005–1.030)
pH: 6 (ref 5.0–8.0)

## 2020-05-05 LAB — WET PREP, GENITAL
Clue Cells Wet Prep HPF POC: NONE SEEN
Sperm: NONE SEEN
Trich, Wet Prep: NONE SEEN
Yeast Wet Prep HPF POC: NONE SEEN

## 2020-05-05 NOTE — MAU Provider Note (Signed)
Chief Complaint:  Vaginal Bleeding   Event Date/Time   First Provider Initiated Contact with Patient 05/05/20 2014     HPI: Rebecca Cooley is a 35 y.o. G2P1 at [redacted]w[redacted]d who presents to maternity admissions reporting small amount of bright red vaginal spotting after a bowel movement this evening. Last IC over a week ago, no abnormal vaginal discharge. Noted dark brown on her pad about an hour later, called on-call RN and was told to come here for evaluation. Denies leaking of fluid, decreased fetal movement, fever, falls, or recent illness.   Pregnancy Course: Receives care at [facility], Dr Billy Coast is primary MD  Past Medical History:  Diagnosis Date  . Chest pain 08/29/2017  . Thyroid disease    OB History  Gravida Para Term Preterm AB Living  2 1       1   SAB IAB Ectopic Multiple Live Births               # Outcome Date GA Lbr Len/2nd Weight Sex Delivery Anes PTL Lv  2 Current           1 Para      Vag-Spont      History reviewed. No pertinent surgical history. Family History  Problem Relation Age of Onset  . Hypertension Mother   . Diabetes Mother   . Dementia Father   . Diabetes Maternal Grandmother    Social History   Tobacco Use  . Smoking status: Never Smoker  . Smokeless tobacco: Never Used  Vaping Use  . Vaping Use: Never used  Substance Use Topics  . Alcohol use: Not Currently    Alcohol/week: 2.0 - 3.0 standard drinks    Types: 2 - 3 Glasses of wine per week    Comment: not while pregnant  . Drug use: No   No Known Allergies No medications prior to admission.   I have reviewed patient's Past Medical Hx, Surgical Hx, Family Hx, Social Hx, medications and allergies.   ROS:  Review of Systems  Constitutional: Negative for fatigue and fever.  HENT: Negative for congestion and sore throat.   Eyes: Negative for visual disturbance.  Respiratory: Negative for cough and shortness of breath.   Cardiovascular: Negative for chest pain.  Gastrointestinal:  Negative for abdominal pain (BH contractions), nausea and vomiting.  Genitourinary: Positive for vaginal bleeding (bright red drops in toilet after BM). Negative for pelvic pain.  Neurological: Negative for dizziness, syncope and headaches.  All other systems reviewed and are negative.  Physical Exam   Patient Vitals for the past 24 hrs:  BP Temp Pulse Resp Height Weight  05/05/20 2208 113/63 - 82 18 - -  05/05/20 1950 119/73 - 96 - - -  05/05/20 1949 - (!) 97.5 F (36.4 C) - 16 5\' 1"  (1.549 m) 142 lb (64.4 kg)   Constitutional: Well-developed, well-nourished female in no acute distress.  Cardiovascular: normal rate & rhythm, no murmur Respiratory: normal effort, lung sounds clear throughout GI: Abd soft, non-tender, gravid appropriate for gestational age. Pos BS x 4 MS: Extremities nontender, no edema, normal ROM Neurologic: Alert and oriented x 4.  GU: no CVA tenderness Pelvic: NEFG, light brown discharge, broken blood vessel noted on the transformation zone but no active bleeding, no blood coming from os  Dilation: Closed Exam by:: CNM (spec exam -no sve)  Fetal Tracing: reactive Baseline: 145 Variability: moderate (appropriate for gestational age) Accelerations: 10x10 Decelerations: one variable then none Toco: UI  Labs: Results for orders placed or performed during the hospital encounter of 05/05/20 (from the past 24 hour(s))  Urinalysis, Routine w reflex microscopic Urine, Clean Catch     Status: Abnormal   Collection Time: 05/05/20  8:10 PM  Result Value Ref Range   Color, Urine YELLOW YELLOW   APPearance CLEAR CLEAR   Specific Gravity, Urine 1.014 1.005 - 1.030   pH 6.0 5.0 - 8.0   Glucose, UA NEGATIVE NEGATIVE mg/dL   Hgb urine dipstick SMALL (A) NEGATIVE   Bilirubin Urine NEGATIVE NEGATIVE   Ketones, ur NEGATIVE NEGATIVE mg/dL   Protein, ur NEGATIVE NEGATIVE mg/dL   Nitrite NEGATIVE NEGATIVE   Leukocytes,Ua NEGATIVE NEGATIVE   RBC / HPF 0-5 0  - 5 RBC/hpf   WBC, UA 0-5 0 - 5 WBC/hpf   Bacteria, UA NONE SEEN NONE SEEN   Squamous Epithelial / LPF 0-5 0 - 5   Mucus PRESENT   Wet prep, genital     Status: Abnormal   Collection Time: 05/05/20  8:34 PM   Specimen: Vaginal  Result Value Ref Range   Yeast Wet Prep HPF POC NONE SEEN NONE SEEN   Trich, Wet Prep NONE SEEN NONE SEEN   Clue Cells Wet Prep HPF POC NONE SEEN NONE SEEN   WBC, Wet Prep HPF POC MANY (A) NONE SEEN   Sperm NONE SEEN     Imaging:    MAU Course: Orders Placed This Encounter  Procedures  . Wet prep, genital  . [place] MFM OB LIMITED  . Urinalysis, Routine w reflex microscopic Urine, Clean Catch  . Discharge patient   No orders of the defined types were placed in this encounter.  MDM: Cervix closed, no active bleeding, no evidence of previa/abruption on ultrasound.  Assessment: 1. NST (non-stress test) reactive   2. Vaginal bleeding in pregnancy, second trimester    Plan: Discharge home in stable condition with bleeding precautions.  See AVS for additional education   Follow-up Information    [facility], [facility]. Go to.   Why: as scheduled for ongoing prenatal care Contact information: [address] [place] [address] [phone]               Allergies as of 05/05/2020   No Known Allergies     Medication List    TAKE these medications   prenatal multivitamin Tabs tablet Take 1 tablet by mouth daily at 12 noon.      Edd Arbour, CNM, MSN, IBCLC Certified Nurse Midwife, [facility]

## 2020-05-05 NOTE — Discharge Instructions (Signed)
Vaginal Bleeding During Pregnancy, Second Trimester °A small amount of bleeding from the vagina is common during pregnancy. This kind of bleeding is also called spotting. Sometimes the bleeding is normal and is not a sign of problems. In some other cases, it is a sign of something serious. °In the second trimester, normal bleeding can happen: °· Because of changes in your blood vessels. °· When you have sex. °· When you have pelvic exams. °In the second trimester, some abnormal things can cause bleeding. These include: °· Infection or swelling. °· Growths in the lowest part of the womb (cervix). These growths are also called polyps. °· Problems of the placenta. The placenta can block the opening of the cervix. The placenta can also break away from the womb. °· Miscarriage. °· Early labor. °· The cervix that opens early, before labor. °· An egg that was not fertilized properly. This causes a type of pregnancy called molar pregnancy. °Tell your doctor right away if there is any bleeding from your vagina. °Follow these instructions at home: °Watch your bleeding °· Watch your condition for any changes. Let your doctor know if you are worried about something. °· Try to know what causes your bleeding. Ask yourself these questions: °? Does the bleeding start on its own? °? Does the bleeding start after something is done, such as sex or a pelvic exam? °· Use a diary to write down the things you see about your bleeding. Write in your diary: °? If the bleeding flows freely without stopping, or if it starts and stops, and then starts again. °? If the bleeding is heavy or light. °? How many pads you use in a day and how much blood is in them. °· Tell your doctor if you pass tissue. He or she may want to see it.   °Activity °· Follow your doctor's instructions about limiting your activities. Ask what activities are safe for you. °· Do not exercise or do activities that take a lot of effort until your doctor says that this is  safe. °· Do not have sex until your doctor says that this is safe. °· Do not lift anything that is heavier than 10 lb (4.5 kg), or the limit that you are told. °· If needed, make plans for someone to help with your normal activities. °Medicines °· Take over-the-counter and prescription medicines only as told by your doctor. °· Do not take aspirin. It can cause bleeding. °General instructions °· Do not use tampons. °· Do not douche. °· Keep all follow-up visits. °Contact a doctor if: °· You have bleeding in the vagina at any time during pregnancy. °· You have cramps. °· You have a fever that does not get better with medicine. °Get help right away if: °· You have very bad cramps in your back or belly (abdomen). °· You have contractions. °· You have chills. °· Your bleeding gets worse. °· You pass large clots or a lot of tissue from your vagina. °· You feel light-headed. °· You feel weak. °· You faint. °· You are leaking fluid from your vagina. °· You have a gush of fluid from your vagina. °Summary °· A small amount of bleeding during pregnancy is normal. But bleeding can be a sign of something serious. Tell your doctor right away about any bleeding from your vagina. °· Try to know what causes your bleeding. Does the bleeding occur on its own, or does it occur after something is done, such as sex or pelvic exams? °·   Follow your doctor's instructions about what activities you can do.  Keep all follow-up visits. This information is not intended to replace advice given to you by your health care provider. Make sure you discuss any questions you have with your health care provider. Document Revised: 10/21/2019 Document Reviewed: 10/21/2019 Elsevier Patient Education  2021 [employer].

## 2020-05-05 NOTE — MAU Note (Addendum)
Had BM about 1730 and saw trickles of blood. Checked later and had darker brown on pad. No recent intercourse. No hx hemorrhoids. No pain. Good FM. Pt reports having some burning on one occ last night when urinated but none since.

## 2020-05-05 NOTE — Progress Notes (Signed)
OK to d/c EFM per Edd Arbour CNM

## 2020-05-05 NOTE — Progress Notes (Signed)
Edd Arbour CNM in to discuss test results and d/c plan. Written and verbal d/c instructions given and understanding voiced

## 2020-05-06 DIAGNOSIS — O352XX0 Maternal care for (suspected) hereditary disease in fetus, not applicable or unspecified: Secondary | ICD-10-CM | POA: Diagnosis not present

## 2020-05-06 DIAGNOSIS — Z3A27 27 weeks gestation of pregnancy: Secondary | ICD-10-CM | POA: Diagnosis not present

## 2020-05-06 DIAGNOSIS — O4692 Antepartum hemorrhage, unspecified, second trimester: Secondary | ICD-10-CM | POA: Diagnosis not present

## 2020-05-06 DIAGNOSIS — Z369 Encounter for antenatal screening, unspecified: Secondary | ICD-10-CM | POA: Diagnosis not present

## 2020-05-10 DIAGNOSIS — Z3689 Encounter for other specified antenatal screening: Secondary | ICD-10-CM | POA: Diagnosis not present

## 2020-05-10 DIAGNOSIS — O99282 Endocrine, nutritional and metabolic diseases complicating pregnancy, second trimester: Secondary | ICD-10-CM | POA: Diagnosis not present

## 2020-05-10 DIAGNOSIS — Z3A27 27 weeks gestation of pregnancy: Secondary | ICD-10-CM | POA: Diagnosis not present

## 2020-05-16 DIAGNOSIS — Z3A28 28 weeks gestation of pregnancy: Secondary | ICD-10-CM | POA: Diagnosis not present

## 2020-05-16 DIAGNOSIS — Z3689 Encounter for other specified antenatal screening: Secondary | ICD-10-CM | POA: Diagnosis not present

## 2020-05-16 DIAGNOSIS — O99810 Abnormal glucose complicating pregnancy: Secondary | ICD-10-CM | POA: Diagnosis not present

## 2020-05-24 DIAGNOSIS — O99282 Endocrine, nutritional and metabolic diseases complicating pregnancy, second trimester: Secondary | ICD-10-CM | POA: Diagnosis not present

## 2020-05-24 DIAGNOSIS — Z3A29 29 weeks gestation of pregnancy: Secondary | ICD-10-CM | POA: Diagnosis not present

## 2020-05-31 ENCOUNTER — Encounter: Payer: BC Managed Care – PPO | Attending: Obstetrics and Gynecology | Admitting: Registered"

## 2020-05-31 ENCOUNTER — Encounter: Payer: Self-pay | Admitting: Registered"

## 2020-05-31 ENCOUNTER — Other Ambulatory Visit: Payer: Self-pay

## 2020-05-31 DIAGNOSIS — O24419 Gestational diabetes mellitus in pregnancy, unspecified control: Secondary | ICD-10-CM | POA: Diagnosis not present

## 2020-05-31 NOTE — Progress Notes (Addendum)
Patient was seen on 05/31/20 for Gestational Diabetes self-management class at the [facility]. The following learning objectives were met by the patient during this course:   States the definition of Gestational Diabetes  States why dietary management is important in controlling blood glucose  Describes the effects each nutrient has on blood glucose levels  Demonstrates ability to create a balanced meal plan  Demonstrates carbohydrate counting   States when to check blood glucose levels  Demonstrates proper blood glucose monitoring techniques  States the effect of stress and exercise on blood glucose levels  States the importance of limiting caffeine and abstaining from alcohol and smoking  Blood glucose monitor given: none, patient has meter and checking prior to class  Patient instructed to monitor glucose levels: FBS: 60 - <95; 1 hour: <140; 2 hour: <120  Patient received handouts:  Nutrition Diabetes and Pregnancy, including carb counting list  Patient will be seen for follow-up as needed.

## 2020-06-06 IMAGING — DX DG CHEST 2V
2 series · 2 of 2 positions shown · non-contrast
Comparison: Two-view chest x-ray a 11/08/2013.

CLINICAL DATA: Left lower chest pain for 2 days.

EXAM:
CHEST - 2 VIEW

[chest pa]
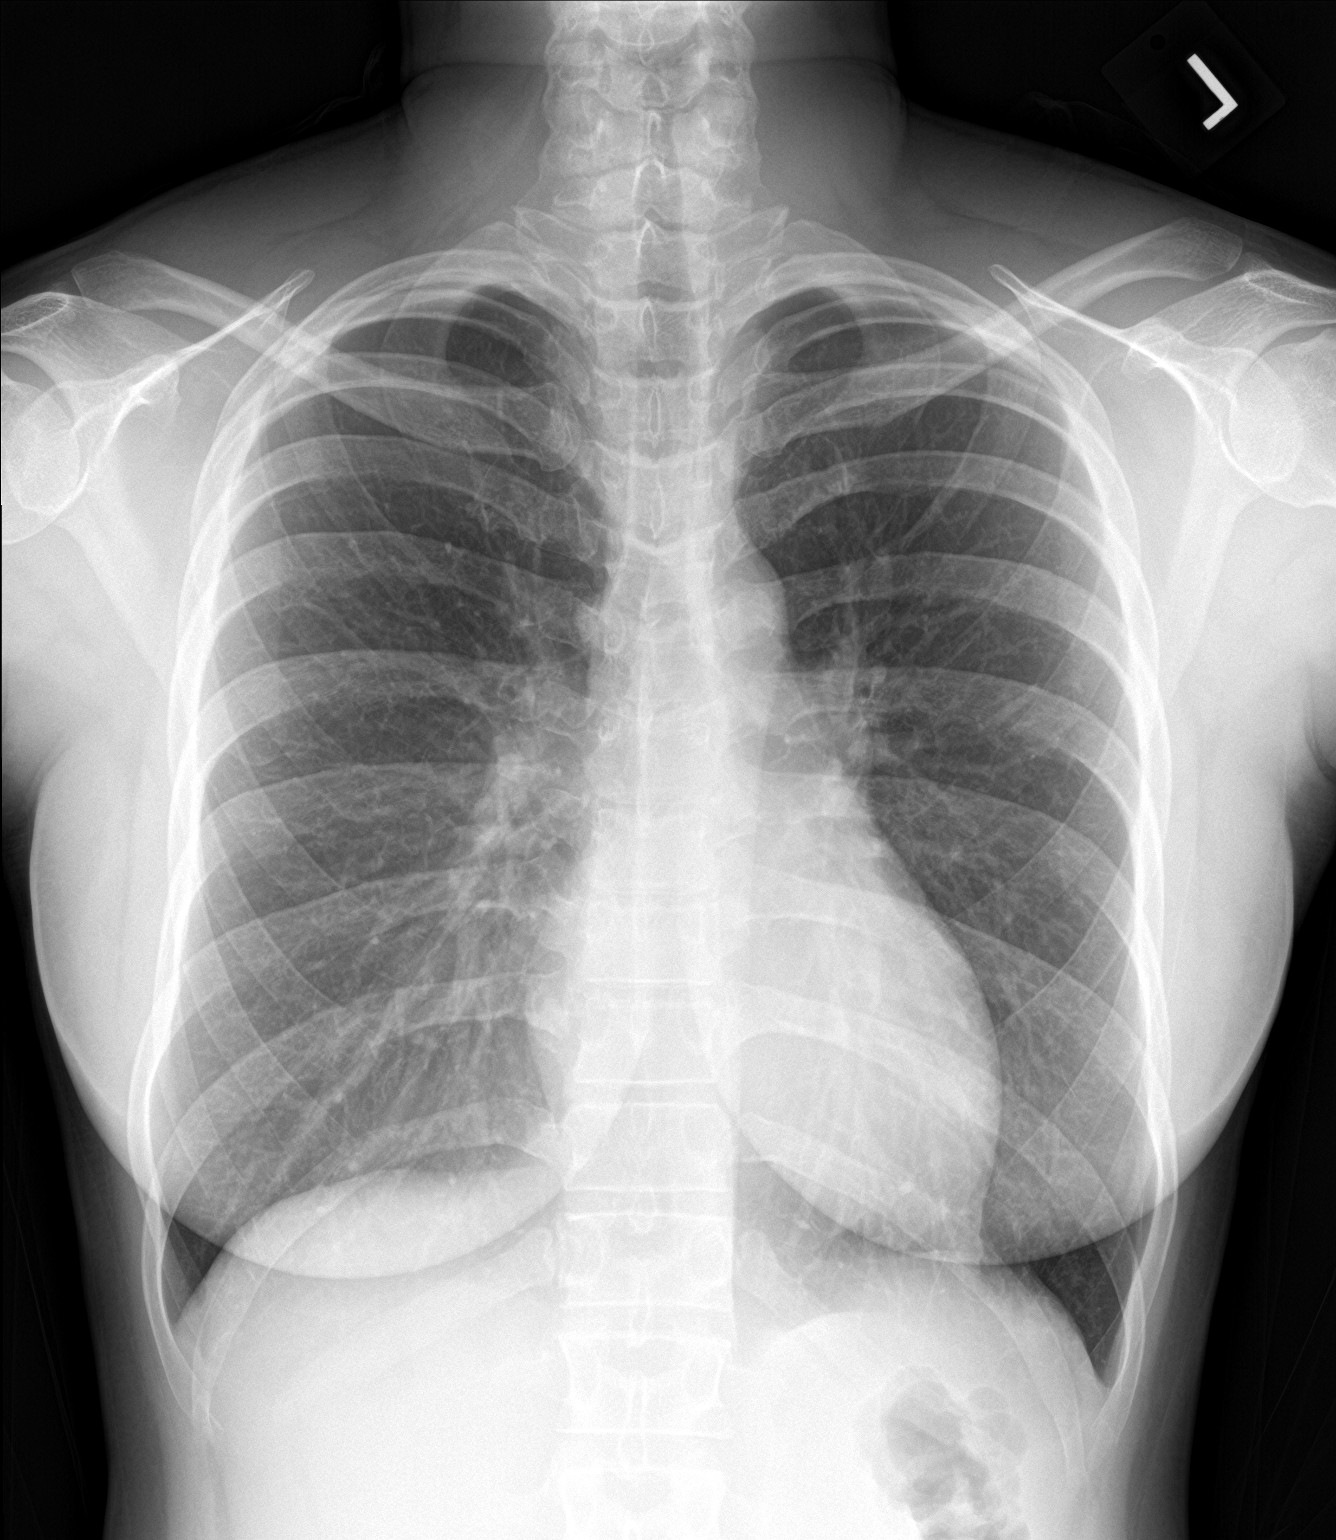

[chest lat]
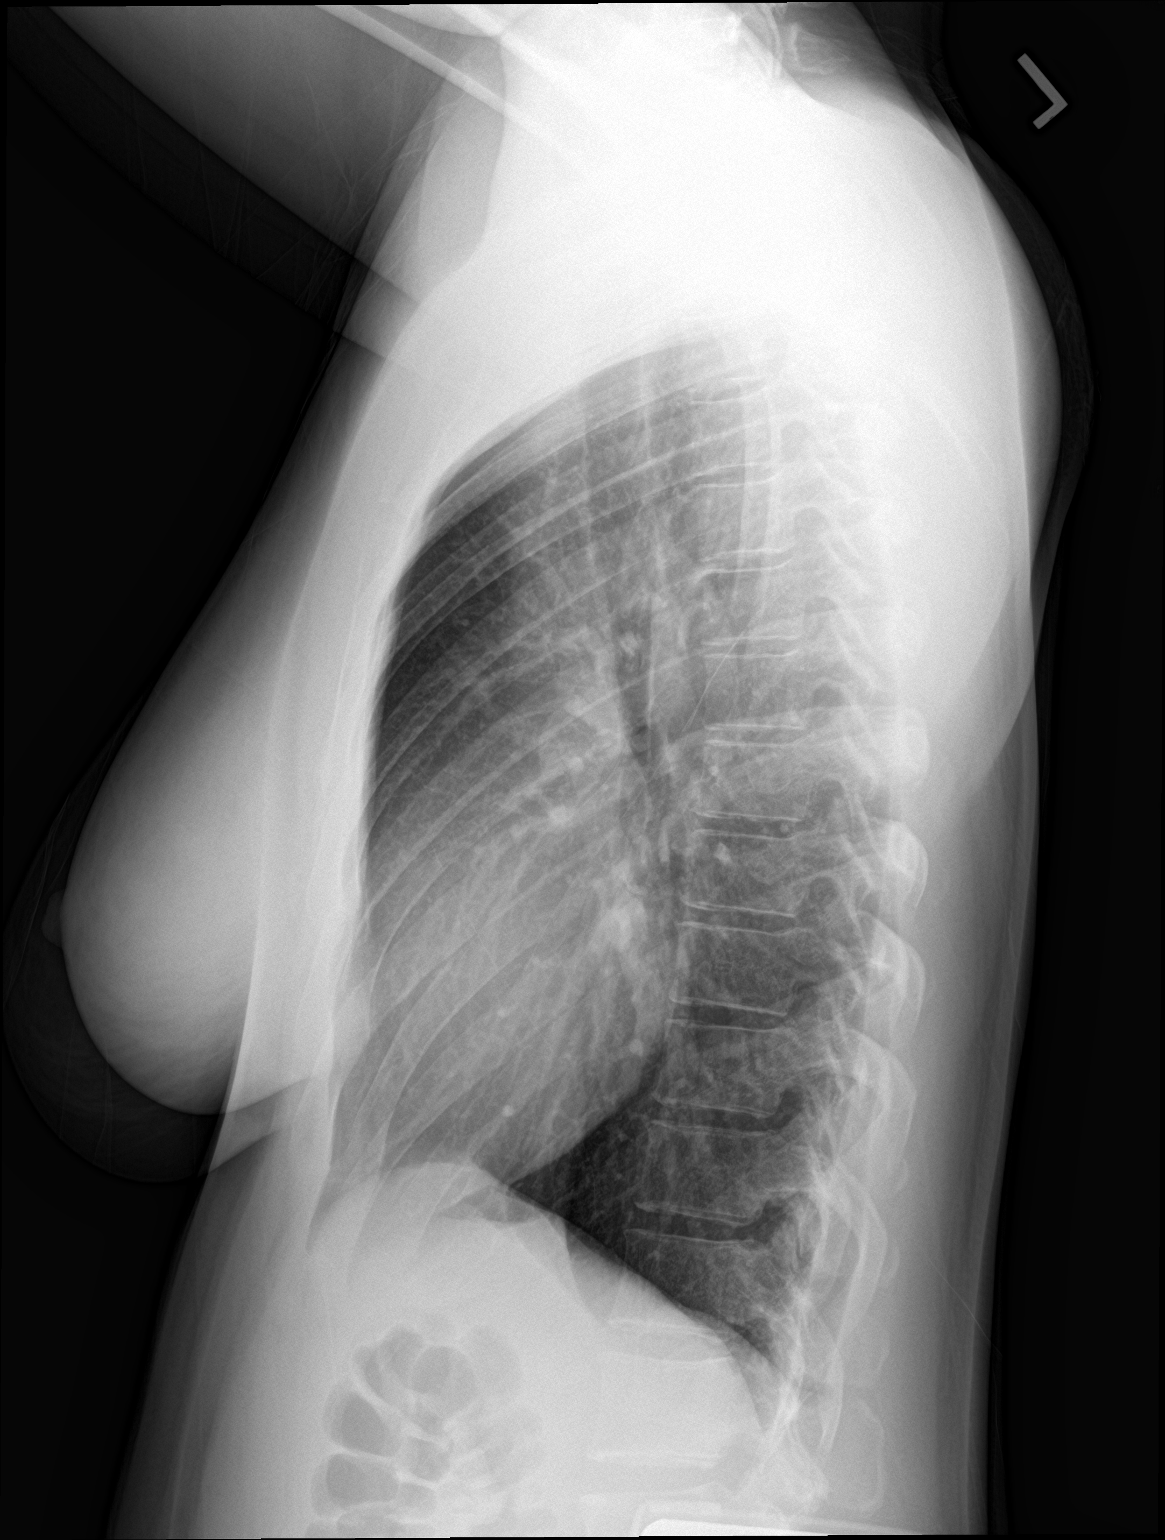

[2 of 2 positions shown; findings below may reference images not displayed]

FINDINGS: The heart size and mediastinal contours are within normal limits.
Both lungs are clear. The visualized skeletal structures are
unremarkable.
IMPRESSION: Negative two view chest x-ray

## 2020-07-06 DIAGNOSIS — R309 Painful micturition, unspecified: Secondary | ICD-10-CM | POA: Diagnosis not present

## 2020-07-06 DIAGNOSIS — Z3685 Encounter for antenatal screening for Streptococcus B: Secondary | ICD-10-CM | POA: Diagnosis not present

## 2020-07-06 LAB — OB RESULTS CONSOLE GBS: GBS: NEGATIVE

## 2020-07-09 DIAGNOSIS — U071 COVID-19: Secondary | ICD-10-CM | POA: Diagnosis not present

## 2020-07-09 DIAGNOSIS — J029 Acute pharyngitis, unspecified: Secondary | ICD-10-CM | POA: Diagnosis not present

## 2020-07-14 DIAGNOSIS — Z3A36 36 weeks gestation of pregnancy: Secondary | ICD-10-CM | POA: Diagnosis not present

## 2020-07-14 DIAGNOSIS — O24419 Gestational diabetes mellitus in pregnancy, unspecified control: Secondary | ICD-10-CM | POA: Diagnosis not present

## 2020-07-19 ENCOUNTER — Other Ambulatory Visit: Payer: Self-pay | Admitting: Obstetrics and Gynecology

## 2020-07-27 ENCOUNTER — Encounter (HOSPITAL_COMMUNITY): Payer: Self-pay

## 2020-07-27 NOTE — Patient Instructions (Signed)
Rebecca Cooley  07/27/2020   Your procedure is scheduled on:  08/03/2020  Arrive at 1245 at Entrance C on [address] at [facility]  and [employer]. You are invited to use the FREE valet parking or use the Visitor's parking deck.  Pick up the phone at the desk and dial [phone].  Call this number if you have problems the morning of surgery: [phone]  Remember:   Do not eat food:(After Midnight) Desps de medianoche.  Do not drink clear liquids: (After Midnight) Desps de medianoche.  Take these medicines the morning of surgery with A SIP OF WATER:  none   Do not wear jewelry, make-up or nail polish.  Do not wear lotions, powders, or perfumes. Do not wear deodorant.  Do not shave 48 hours prior to surgery.  Do not bring valuables to the hospital.  [facility] is not   responsible for any belongings or valuables brought to the hospital.  Contacts, dentures or bridgework may not be worn into surgery.  Leave suitcase in the car. After surgery it may be brought to your room.  For patients admitted to the hospital, checkout time is 11:00 AM the day of              discharge.      Please read over the following fact sheets that you were given:     Preparing for Surgery

## 2020-07-28 ENCOUNTER — Inpatient Hospital Stay (HOSPITAL_COMMUNITY)
Admission: AD | Admit: 2020-07-28 | Discharge: 2020-07-29 | Disposition: A | Discharge status: Home / Self Care | Payer: BC Managed Care – PPO | Attending: Obstetrics | Admitting: Obstetrics

## 2020-07-28 ENCOUNTER — Other Ambulatory Visit: Payer: Self-pay

## 2020-07-28 ENCOUNTER — Encounter (HOSPITAL_COMMUNITY): Payer: Self-pay | Admitting: Obstetrics

## 2020-07-28 DIAGNOSIS — O471 False labor at or after 37 completed weeks of gestation: Secondary | ICD-10-CM | POA: Insufficient documentation

## 2020-07-28 DIAGNOSIS — O479 False labor, unspecified: Secondary | ICD-10-CM

## 2020-07-28 DIAGNOSIS — O321XX0 Maternal care for breech presentation, not applicable or unspecified: Secondary | ICD-10-CM | POA: Insufficient documentation

## 2020-07-28 DIAGNOSIS — Z3A39 39 weeks gestation of pregnancy: Secondary | ICD-10-CM | POA: Insufficient documentation

## 2020-07-28 NOTE — MAU Note (Signed)
Breech presentation per BS [place].

## 2020-07-28 NOTE — MAU Note (Signed)
Pt reports SVE on Thursday was closed. Reports stronger contractions today. Some pinkish discharge. Scheduled C/S for breech.

## 2020-07-29 DIAGNOSIS — O471 False labor at or after 37 completed weeks of gestation: Secondary | ICD-10-CM

## 2020-07-29 DIAGNOSIS — O321XX0 Maternal care for breech presentation, not applicable or unspecified: Secondary | ICD-10-CM | POA: Diagnosis not present

## 2020-07-29 DIAGNOSIS — Z3A39 39 weeks gestation of pregnancy: Secondary | ICD-10-CM

## 2020-07-29 NOTE — Discharge Instructions (Signed)
Reasons to return to MAU at [facility]:  1.  Contractions are  5 minutes apart or less, each last 1 minute, these have been going on for 1-2 hours, and you cannot walk or talk during them 2.  You have a large gush of fluid, or a trickle of fluid that will not stop and you have to wear a pad 3.  You have bleeding that is bright red, heavier than spotting--like menstrual bleeding (spotting can be normal in early labor or after a check of your cervix) 4.  You do not feel the baby moving like he/she normally does  

## 2020-07-29 NOTE — MAU Provider Note (Signed)
Ms. Rebecca Cooley is a G2P1001 at [redacted]w[redacted]d seen in MAU for labor.   SVE by RN Dilation: 1.5 Effacement (%): 20 Cervical Position: Posterior Presentation: Undeterminable Exam by:: tlytle RN   Unchanged in 1+ hours in MAU  NST - FHR: 135 bpm / moderate variability / accels present / isolated decel x 1 down to 90s for 1.5 minutes followed by reactive NST with accelerations NST reactive / TOCO: irregular every 3-10 mins   Plan:  D/C home with labor precautions Keep scheduled appt with [facility]/scheduled cesarean section for breech presentation on 08/03/20  Sharen Counter, CNM  07/29/2020 12:37 AM

## 2020-08-01 ENCOUNTER — Encounter (HOSPITAL_COMMUNITY)
Admission: RE | Admit: 2020-08-01 | Discharge: 2020-08-01 | Disposition: A | Discharge status: Home / Self Care | Payer: BC Managed Care – PPO | Source: Ambulatory Visit | Attending: Obstetrics and Gynecology | Admitting: Obstetrics and Gynecology

## 2020-08-01 ENCOUNTER — Other Ambulatory Visit (HOSPITAL_COMMUNITY): Payer: BC Managed Care – PPO

## 2020-08-01 ENCOUNTER — Other Ambulatory Visit: Payer: Self-pay

## 2020-08-01 DIAGNOSIS — O24429 Gestational diabetes mellitus in childbirth, unspecified control: Secondary | ICD-10-CM | POA: Diagnosis not present

## 2020-08-01 DIAGNOSIS — Z01812 Encounter for preprocedural laboratory examination: Secondary | ICD-10-CM | POA: Insufficient documentation

## 2020-08-01 DIAGNOSIS — O321XX0 Maternal care for breech presentation, not applicable or unspecified: Secondary | ICD-10-CM | POA: Diagnosis not present

## 2020-08-01 DIAGNOSIS — Z8616 Personal history of COVID-19: Secondary | ICD-10-CM | POA: Diagnosis not present

## 2020-08-01 DIAGNOSIS — Z0542 Observation and evaluation of newborn for suspected metabolic condition ruled out: Secondary | ICD-10-CM | POA: Diagnosis not present

## 2020-08-01 DIAGNOSIS — Z3A00 Weeks of gestation of pregnancy not specified: Secondary | ICD-10-CM | POA: Diagnosis not present

## 2020-08-01 DIAGNOSIS — O24420 Gestational diabetes mellitus in childbirth, diet controlled: Secondary | ICD-10-CM | POA: Diagnosis not present

## 2020-08-01 DIAGNOSIS — O9902 Anemia complicating childbirth: Secondary | ICD-10-CM | POA: Diagnosis not present

## 2020-08-01 DIAGNOSIS — Z412 Encounter for routine and ritual male circumcision: Secondary | ICD-10-CM | POA: Diagnosis not present

## 2020-08-01 DIAGNOSIS — M5489 Other dorsalgia: Secondary | ICD-10-CM | POA: Diagnosis not present

## 2020-08-01 DIAGNOSIS — Z3A39 39 weeks gestation of pregnancy: Secondary | ICD-10-CM | POA: Diagnosis not present

## 2020-08-01 DIAGNOSIS — Q690 Accessory finger(s): Secondary | ICD-10-CM | POA: Diagnosis not present

## 2020-08-01 DIAGNOSIS — Z23 Encounter for immunization: Secondary | ICD-10-CM | POA: Diagnosis not present

## 2020-08-01 HISTORY — DX: Gestational diabetes mellitus in pregnancy, unspecified control: O24.419

## 2020-08-01 LAB — CBC
HCT: 35.8 % — ABNORMAL LOW (ref 36.0–46.0)
Hemoglobin: 11.8 g/dL — ABNORMAL LOW (ref 12.0–15.0)
MCH: 30.3 pg (ref 26.0–34.0)
MCHC: 33 g/dL (ref 30.0–36.0)
MCV: 92 fL (ref 80.0–100.0)
Platelets: 166 10*3/uL (ref 150–400)
RBC: 3.89 MIL/uL (ref 3.87–5.11)
RDW: 13.2 % (ref 11.5–15.5)
WBC: 8.7 10*3/uL (ref 4.0–10.5)
nRBC: 0 % (ref 0.0–0.2)

## 2020-08-01 LAB — TYPE AND SCREEN
ABO/RH(D): A POS
Antibody Screen: NEGATIVE

## 2020-08-01 LAB — RPR: RPR Ser Ql: NONREACTIVE

## 2020-08-03 ENCOUNTER — Encounter (HOSPITAL_COMMUNITY)
Admission: RE | Disposition: A | Discharge status: Home / Self Care | Payer: Self-pay | Source: Home / Self Care | Attending: Obstetrics and Gynecology

## 2020-08-03 ENCOUNTER — Other Ambulatory Visit: Payer: Self-pay

## 2020-08-03 ENCOUNTER — Inpatient Hospital Stay (HOSPITAL_COMMUNITY): Payer: BC Managed Care – PPO | Admitting: Anesthesiology

## 2020-08-03 ENCOUNTER — Inpatient Hospital Stay (HOSPITAL_COMMUNITY)
Admission: RE | Admit: 2020-08-03 | Discharge: 2020-08-06 | DRG: 788 | Disposition: A | Discharge status: Home / Self Care | Payer: BC Managed Care – PPO | Attending: Obstetrics and Gynecology | Admitting: Obstetrics and Gynecology

## 2020-08-03 ENCOUNTER — Encounter (HOSPITAL_COMMUNITY): Payer: Self-pay | Admitting: Obstetrics and Gynecology

## 2020-08-03 DIAGNOSIS — Z8616 Personal history of COVID-19: Secondary | ICD-10-CM

## 2020-08-03 DIAGNOSIS — O9902 Anemia complicating childbirth: Secondary | ICD-10-CM | POA: Diagnosis not present

## 2020-08-03 DIAGNOSIS — O321XX0 Maternal care for breech presentation, not applicable or unspecified: Principal | ICD-10-CM | POA: Diagnosis present

## 2020-08-03 DIAGNOSIS — O24420 Gestational diabetes mellitus in childbirth, diet controlled: Secondary | ICD-10-CM | POA: Diagnosis present

## 2020-08-03 DIAGNOSIS — Z8639 Personal history of other endocrine, nutritional and metabolic disease: Secondary | ICD-10-CM

## 2020-08-03 DIAGNOSIS — Z3A39 39 weeks gestation of pregnancy: Secondary | ICD-10-CM

## 2020-08-03 DIAGNOSIS — O24419 Gestational diabetes mellitus in pregnancy, unspecified control: Secondary | ICD-10-CM | POA: Diagnosis present

## 2020-08-03 LAB — GLUCOSE, CAPILLARY
Glucose-Capillary: 74 mg/dL (ref 70–99)
Glucose-Capillary: 85 mg/dL (ref 70–99)

## 2020-08-03 SURGERY — Surgical Case
Anesthesia: Spinal | Site: Abdomen | Wound class: Clean Contaminated

## 2020-08-03 MED ORDER — ORAL CARE MOUTH RINSE: 15.0000 mL | Freq: Once | OROMUCOSAL | Status: AC

## 2020-08-03 MED ORDER — KETOROLAC TROMETHAMINE 30 MG/ML IJ SOLN
30.0000 mg | Freq: Four times a day (QID) | INTRAMUSCULAR | Status: AC
  Administered 2020-08-03 – 2020-08-04 (×2): 30 mg via INTRAVENOUS
  Filled 2020-08-03 (×3): qty 1

## 2020-08-03 MED ORDER — BUPIVACAINE IN DEXTROSE 0.75-8.25 % IT SOLN
INTRATHECAL | Status: DC | PRN
  Administered 2020-08-03: 1.6 mL via INTRATHECAL

## 2020-08-03 MED ORDER — OXYCODONE HCL 5 MG/5ML PO SOLN: 5.0000 mg | Freq: Once | ORAL | Status: DC | PRN

## 2020-08-03 MED ORDER — SENNOSIDES-DOCUSATE SODIUM 8.6-50 MG PO TABS
2.0000 | ORAL_TABLET | Freq: Every day | ORAL | Status: DC
  Administered 2020-08-04 – 2020-08-06 (×3): 2 via ORAL
  Filled 2020-08-03 (×3): qty 2

## 2020-08-03 MED ORDER — SOD CITRATE-CITRIC ACID 500-334 MG/5ML PO SOLN
30.0000 mL | Freq: Once | ORAL | Status: AC
  Administered 2020-08-03: 30 mL via ORAL

## 2020-08-03 MED ORDER — PROMETHAZINE HCL 25 MG/ML IJ SOLN: 6.2500 mg | INTRAMUSCULAR | Status: DC | PRN

## 2020-08-03 MED ORDER — SCOPOLAMINE 1 MG/3DAYS TD PT72: 1.0000 | MEDICATED_PATCH | Freq: Once | TRANSDERMAL | Status: DC

## 2020-08-03 MED ORDER — NALBUPHINE HCL 10 MG/ML IJ SOLN: 5.0000 mg | INTRAMUSCULAR | Status: DC | PRN

## 2020-08-03 MED ORDER — SODIUM CHLORIDE 0.9 % IR SOLN
Status: DC | PRN
  Administered 2020-08-03: 1000 mL

## 2020-08-03 MED ORDER — PHENYLEPHRINE HCL-NACL 20-0.9 MG/250ML-% IV SOLN
INTRAVENOUS | Status: DC | PRN
  Administered 2020-08-03: 60 ug/min via INTRAVENOUS

## 2020-08-03 MED ORDER — STERILE WATER FOR IRRIGATION IR SOLN
Status: DC | PRN
  Administered 2020-08-03: 1000 mL

## 2020-08-03 MED ORDER — TETANUS-DIPHTH-ACELL PERTUSSIS 5-2.5-18.5 LF-MCG/0.5 IM SUSY: 0.5000 mL | PREFILLED_SYRINGE | Freq: Once | INTRAMUSCULAR | Status: DC

## 2020-08-03 MED ORDER — NALOXONE HCL 4 MG/10ML IJ SOLN
1.0000 ug/kg/h | INTRAVENOUS | Status: DC | PRN
  Filled 2020-08-03: qty 5

## 2020-08-03 MED ORDER — OXYTOCIN-SODIUM CHLORIDE 30-0.9 UT/500ML-% IV SOLN
INTRAVENOUS | Status: DC | PRN
  Administered 2020-08-03: 150 mL via INTRAVENOUS

## 2020-08-03 MED ORDER — NALBUPHINE HCL 10 MG/ML IJ SOLN: 5.0000 mg | Freq: Once | INTRAMUSCULAR | Status: DC | PRN

## 2020-08-03 MED ORDER — OXYTOCIN-SODIUM CHLORIDE 30-0.9 UT/500ML-% IV SOLN
INTRAVENOUS | Status: AC
  Filled 2020-08-03: qty 500

## 2020-08-03 MED ORDER — ONDANSETRON HCL 4 MG/2ML IJ SOLN: 4.0000 mg | Freq: Three times a day (TID) | INTRAMUSCULAR | Status: DC | PRN

## 2020-08-03 MED ORDER — FENTANYL CITRATE (PF) 100 MCG/2ML IJ SOLN
INTRAMUSCULAR | Status: DC | PRN
  Administered 2020-08-03: 15 ug via INTRATHECAL

## 2020-08-03 MED ORDER — CEFAZOLIN SODIUM-DEXTROSE 2-4 GM/100ML-% IV SOLN
INTRAVENOUS | Status: AC
  Filled 2020-08-03: qty 100

## 2020-08-03 MED ORDER — WITCH HAZEL-GLYCERIN EX PADS: 1.0000 "application " | MEDICATED_PAD | CUTANEOUS | Status: DC | PRN

## 2020-08-03 MED ORDER — DEXAMETHASONE SODIUM PHOSPHATE 4 MG/ML IJ SOLN
INTRAMUSCULAR | Status: DC | PRN
  Administered 2020-08-03: 5 mg via INTRAVENOUS

## 2020-08-03 MED ORDER — ONDANSETRON HCL 4 MG/2ML IJ SOLN
INTRAMUSCULAR | Status: AC
  Filled 2020-08-03: qty 2

## 2020-08-03 MED ORDER — LACTATED RINGERS IV SOLN: INTRAVENOUS | Status: DC | PRN

## 2020-08-03 MED ORDER — CHLORHEXIDINE GLUCONATE 0.12 % MT SOLN
15.0000 mL | Freq: Once | OROMUCOSAL | Status: AC
  Administered 2020-08-03: 15 mL via OROMUCOSAL

## 2020-08-03 MED ORDER — OXYCODONE-ACETAMINOPHEN 5-325 MG PO TABS
1.0000 | ORAL_TABLET | ORAL | Status: DC | PRN
  Administered 2020-08-04 – 2020-08-06 (×7): 1 via ORAL
  Filled 2020-08-03 (×5): qty 1
  Filled 2020-08-03: qty 2
  Filled 2020-08-03: qty 1

## 2020-08-03 MED ORDER — SIMETHICONE 80 MG PO CHEW
80.0000 mg | CHEWABLE_TABLET | ORAL | Status: DC | PRN
  Administered 2020-08-04 – 2020-08-05 (×2): 80 mg via ORAL
  Filled 2020-08-03 (×2): qty 1

## 2020-08-03 MED ORDER — OXYCODONE HCL 5 MG PO TABS
5.0000 mg | ORAL_TABLET | Freq: Once | ORAL | Status: DC | PRN
Start: 2020-08-03 — End: 2020-08-03

## 2020-08-03 MED ORDER — MEPERIDINE HCL 25 MG/ML IJ SOLN: 6.2500 mg | INTRAMUSCULAR | Status: DC | PRN

## 2020-08-03 MED ORDER — IBUPROFEN 600 MG PO TABS
600.0000 mg | ORAL_TABLET | Freq: Four times a day (QID) | ORAL | Status: DC
  Administered 2020-08-04 – 2020-08-06 (×9): 600 mg via ORAL
  Filled 2020-08-03 (×9): qty 1

## 2020-08-03 MED ORDER — KETOROLAC TROMETHAMINE 30 MG/ML IJ SOLN
30.0000 mg | Freq: Four times a day (QID) | INTRAMUSCULAR | Status: AC | PRN
  Administered 2020-08-03: 30 mg via INTRAVENOUS
  Filled 2020-08-03: qty 1

## 2020-08-03 MED ORDER — ZOLPIDEM TARTRATE 5 MG PO TABS: 5.0000 mg | ORAL_TABLET | Freq: Every evening | ORAL | Status: DC | PRN

## 2020-08-03 MED ORDER — KETOROLAC TROMETHAMINE 30 MG/ML IJ SOLN: 30.0000 mg | Freq: Four times a day (QID) | INTRAMUSCULAR | Status: AC | PRN

## 2020-08-03 MED ORDER — PRENATAL MULTIVITAMIN CH
1.0000 | ORAL_TABLET | Freq: Every day | ORAL | Status: DC
  Administered 2020-08-04 – 2020-08-05 (×2): 1 via ORAL
  Filled 2020-08-03 (×2): qty 1

## 2020-08-03 MED ORDER — LACTATED RINGERS IV SOLN: INTRAVENOUS | Status: DC

## 2020-08-03 MED ORDER — PHENYLEPHRINE HCL-NACL 20-0.9 MG/250ML-% IV SOLN
INTRAVENOUS | Status: AC
  Filled 2020-08-03: qty 250

## 2020-08-03 MED ORDER — BUPIVACAINE HCL (PF) 0.25 % IJ SOLN
INTRAMUSCULAR | Status: AC
  Filled 2020-08-03: qty 20

## 2020-08-03 MED ORDER — POVIDONE-IODINE 10 % EX SWAB: 2.0000 "application " | Freq: Once | CUTANEOUS | Status: DC

## 2020-08-03 MED ORDER — COCONUT OIL OIL: 1.0000 "application " | TOPICAL_OIL | Status: DC | PRN

## 2020-08-03 MED ORDER — METHYLERGONOVINE MALEATE 0.2 MG/ML IJ SOLN: 0.2000 mg | INTRAMUSCULAR | Status: DC | PRN

## 2020-08-03 MED ORDER — ONDANSETRON HCL 4 MG/2ML IJ SOLN
INTRAMUSCULAR | Status: DC | PRN
  Administered 2020-08-03: 4 mg via INTRAVENOUS

## 2020-08-03 MED ORDER — SODIUM CHLORIDE 0.9% FLUSH: 3.0000 mL | INTRAVENOUS | Status: DC | PRN

## 2020-08-03 MED ORDER — DIBUCAINE (PERIANAL) 1 % EX OINT
1.0000 | TOPICAL_OINTMENT | CUTANEOUS | Status: DC | PRN
Start: 2020-08-03 — End: 2020-08-06

## 2020-08-03 MED ORDER — SCOPOLAMINE 1 MG/3DAYS TD PT72
1.0000 | MEDICATED_PATCH | Freq: Once | TRANSDERMAL | Status: DC
  Administered 2020-08-03: 1.5 mg via TRANSDERMAL

## 2020-08-03 MED ORDER — SOD CITRATE-CITRIC ACID 500-334 MG/5ML PO SOLN
ORAL | Status: AC
  Filled 2020-08-03: qty 30

## 2020-08-03 MED ORDER — OXYTOCIN-SODIUM CHLORIDE 30-0.9 UT/500ML-% IV SOLN: 2.5000 [IU]/h | INTRAVENOUS | Status: AC

## 2020-08-03 MED ORDER — DIPHENHYDRAMINE HCL 25 MG PO CAPS
25.0000 mg | ORAL_CAPSULE | ORAL | Status: DC | PRN
  Administered 2020-08-03 – 2020-08-04 (×2): 25 mg via ORAL
  Filled 2020-08-03 (×3): qty 1

## 2020-08-03 MED ORDER — FENTANYL CITRATE (PF) 100 MCG/2ML IJ SOLN: 25.0000 ug | INTRAMUSCULAR | Status: DC | PRN

## 2020-08-03 MED ORDER — SIMETHICONE 80 MG PO CHEW
80.0000 mg | CHEWABLE_TABLET | Freq: Three times a day (TID) | ORAL | Status: DC
  Administered 2020-08-03 – 2020-08-06 (×8): 80 mg via ORAL
  Filled 2020-08-03 (×9): qty 1

## 2020-08-03 MED ORDER — DIPHENHYDRAMINE HCL 25 MG PO CAPS: 25.0000 mg | ORAL_CAPSULE | Freq: Four times a day (QID) | ORAL | Status: DC | PRN

## 2020-08-03 MED ORDER — FENTANYL CITRATE (PF) 100 MCG/2ML IJ SOLN
INTRAMUSCULAR | Status: AC
  Filled 2020-08-03: qty 2

## 2020-08-03 MED ORDER — SCOPOLAMINE 1 MG/3DAYS TD PT72
MEDICATED_PATCH | TRANSDERMAL | Status: AC
  Filled 2020-08-03: qty 1

## 2020-08-03 MED ORDER — ACETAMINOPHEN 10 MG/ML IV SOLN
INTRAVENOUS | Status: AC
  Filled 2020-08-03: qty 100

## 2020-08-03 MED ORDER — FAMOTIDINE 20 MG PO TABS
20.0000 mg | ORAL_TABLET | Freq: Once | ORAL | Status: AC
Start: 2020-08-03 — End: 2020-08-03
  Administered 2020-08-03: 20 mg via ORAL

## 2020-08-03 MED ORDER — FAMOTIDINE 20 MG PO TABS
ORAL_TABLET | ORAL | Status: AC
  Filled 2020-08-03: qty 1

## 2020-08-03 MED ORDER — DEXAMETHASONE SODIUM PHOSPHATE 4 MG/ML IJ SOLN
INTRAMUSCULAR | Status: AC
  Filled 2020-08-03: qty 1

## 2020-08-03 MED ORDER — MORPHINE SULFATE (PF) 0.5 MG/ML IJ SOLN
INTRAMUSCULAR | Status: DC | PRN
  Administered 2020-08-03: .15 mg via INTRATHECAL

## 2020-08-03 MED ORDER — BUPIVACAINE HCL (PF) 0.25 % IJ SOLN
INTRAMUSCULAR | Status: DC | PRN
  Administered 2020-08-03: 20 mL

## 2020-08-03 MED ORDER — MENTHOL 3 MG MT LOZG
1.0000 | LOZENGE | OROMUCOSAL | Status: DC | PRN
  Administered 2020-08-05: 3 mg via ORAL
  Filled 2020-08-03: qty 9

## 2020-08-03 MED ORDER — CEFAZOLIN SODIUM-DEXTROSE 2-4 GM/100ML-% IV SOLN
2.0000 g | INTRAVENOUS | Status: AC
  Administered 2020-08-03: 2 g via INTRAVENOUS

## 2020-08-03 MED ORDER — CHLORHEXIDINE GLUCONATE 0.12 % MT SOLN
OROMUCOSAL | Status: AC
  Filled 2020-08-03: qty 15

## 2020-08-03 MED ORDER — NALOXONE HCL 0.4 MG/ML IJ SOLN: 0.4000 mg | INTRAMUSCULAR | Status: DC | PRN

## 2020-08-03 MED ORDER — MORPHINE SULFATE (PF) 0.5 MG/ML IJ SOLN
INTRAMUSCULAR | Status: AC
  Filled 2020-08-03: qty 10

## 2020-08-03 MED ORDER — METHYLERGONOVINE MALEATE 0.2 MG PO TABS: 0.2000 mg | ORAL_TABLET | ORAL | Status: DC | PRN

## 2020-08-03 MED ORDER — DIPHENHYDRAMINE HCL 50 MG/ML IJ SOLN
12.5000 mg | INTRAMUSCULAR | Status: DC | PRN
  Administered 2020-08-03: 12.5 mg via INTRAVENOUS
  Filled 2020-08-03: qty 1

## 2020-08-03 MED ORDER — ACETAMINOPHEN 10 MG/ML IV SOLN
INTRAVENOUS | Status: DC | PRN
  Administered 2020-08-03: 1000 mg via INTRAVENOUS

## 2020-08-03 SURGICAL SUPPLY — 40 items
APL SKNCLS STERI-STRIP NONHPOA (GAUZE/BANDAGES/DRESSINGS) ×1
BENZOIN TINCTURE PRP APPL 2/3 (GAUZE/BANDAGES/DRESSINGS) ×1 IMPLANT
CHLORAPREP W/TINT 26ML (MISCELLANEOUS) ×2 IMPLANT
CLAMP CORD UMBIL (MISCELLANEOUS) IMPLANT
CLOSURE STERI STRIP 1/2 X4 (GAUZE/BANDAGES/DRESSINGS) ×1 IMPLANT
CLOTH BEACON ORANGE TIMEOUT ST (SAFETY) ×2 IMPLANT
DRSG OPSITE POSTOP 4X10 (GAUZE/BANDAGES/DRESSINGS) ×2 IMPLANT
ELECT REM PT RETURN 9FT ADLT (ELECTROSURGICAL) ×2
ELECTRODE REM PT RTRN 9FT ADLT (ELECTROSURGICAL) ×1 IMPLANT
EXTRACTOR VACUUM M CUP 4 TUBE (SUCTIONS) IMPLANT
GLOVE BIO SURGEON STRL SZ7.5 (GLOVE) ×2 IMPLANT
GLOVE BIOGEL PI IND STRL 7.0 (GLOVE) ×1 IMPLANT
GLOVE BIOGEL PI INDICATOR 7.0 (GLOVE) ×1
GOWN STRL REUS W/TWL LRG LVL3 (GOWN DISPOSABLE) ×4 IMPLANT
KIT ABG SYR 3ML LUER SLIP (SYRINGE) IMPLANT
NDL HYPO 25X5/8 SAFETYGLIDE (NEEDLE) IMPLANT
NDL SPNL 20GX3.5 QUINCKE YW (NEEDLE) IMPLANT
NEEDLE HYPO 22GX1.5 SAFETY (NEEDLE) ×2 IMPLANT
NEEDLE HYPO 25X5/8 SAFETYGLIDE (NEEDLE) IMPLANT
NEEDLE SPNL 20GX3.5 QUINCKE YW (NEEDLE) IMPLANT
NS IRRIG 1000ML POUR BTL (IV SOLUTION) ×2 IMPLANT
PACK C SECTION WH (CUSTOM PROCEDURE TRAY) ×2 IMPLANT
PENCIL SMOKE EVAC W/HOLSTER (ELECTROSURGICAL) ×2 IMPLANT
STRIP CLOSURE SKIN 1/2X4 (GAUZE/BANDAGES/DRESSINGS) ×1 IMPLANT
SUT MNCRL 0 VIOLET CTX 36 (SUTURE) ×2 IMPLANT
SUT MNCRL AB 3-0 PS2 27 (SUTURE) IMPLANT
SUT MON AB 2-0 CT1 27 (SUTURE) ×2 IMPLANT
SUT MON AB-0 CT1 36 (SUTURE) ×4 IMPLANT
SUT MONOCRYL 0 CTX 36 (SUTURE) ×4
SUT PLAIN 0 NONE (SUTURE) IMPLANT
SUT PLAIN 2 0 (SUTURE)
SUT PLAIN 2 0 XLH (SUTURE) IMPLANT
SUT PLAIN ABS 2-0 CT1 27XMFL (SUTURE) IMPLANT
SUT PROLENE 1 CT (SUTURE) ×1 IMPLANT
SUT VIC AB 4-0 KS 27 (SUTURE) ×1 IMPLANT
SYR 20CC LL (SYRINGE) IMPLANT
SYR CONTROL 10ML LL (SYRINGE) ×2 IMPLANT
TOWEL OR 17X24 6PK STRL BLUE (TOWEL DISPOSABLE) ×2 IMPLANT
TRAY FOLEY W/BAG SLVR 14FR LF (SET/KITS/TRAYS/PACK) ×2 IMPLANT
WATER STERILE IRR 1000ML POUR (IV SOLUTION) ×2 IMPLANT

## 2020-08-03 NOTE — Lactation Note (Signed)
This note was copied from a baby's chart. Lactation Consultation Note  Patient Name: Rebecca Cooley DTOIZ'T Date: 08/03/2020 Reason for consult: Initial assessment;Mother's request;Difficult latch;1st time breastfeeding;Term;Maternal endocrine disorder Age:34 hours  Infant attempted to latch on left breast nipple inverted. LC switched to right breast, some signs of milk transfer.  Mom set up on DEBP sized with 24 moved to 27 flange. Mom stated more comfortable fit.   Infant has labial attachment and lingual attachment. He can extend his tongue pass the gum line.   Plan 1. To feed based on cues 8-12x in24 hr period no more than 4 hrs without an attempt Mom to offer both breasts and look for signs of milk transfer.            2. If unable to latch, Mom to offer EBM via spoon             3 Mom pumping with DEBP as stated above.              4. I and O sheet reviewed              5. LC brochure of inpatient and outpatient services.   Maternal Data Has patient been taught Hand Expression?: Yes Does the patient have breastfeeding experience prior to this delivery?: No  Feeding Mother's Current Feeding Choice: Breast Milk  LATCH Score Latch: Repeated attempts needed to sustain latch, nipple held in mouth throughout feeding, stimulation needed to elicit sucking reflex.  Audible Swallowing: A few with stimulation  Type of Nipple: Inverted  Comfort (Breast/Nipple): Soft / non-tender  Hold (Positioning): Assistance needed to correctly position infant at breast and maintain latch.  LATCH Score: 5   Lactation Tools Discussed/Used Tools: Pump;Shells;Flanges Flange Size: 27 Breast pump type: Double-Electric Breast Pump Pump Education: Setup, frequency, and cleaning;Milk Storage Reason for Pumping: increase stimulation Pumping frequency: eery 3 hrs for 15 min  Interventions Interventions: Breast feeding basics reviewed;Breast compression;Assisted with latch;Adjust position;Skin  to skin;Support pillows;DEBP;Breast massage;Position options;Hand express;Expressed milk;Education;Pre-pump if needed;Shells  Discharge Pump: Personal WIC Program: No  Consult Status Consult Status: Follow-up Date: 08/04/20 Follow-up type: In-patient    Wallie Lagrand  Nicholson-Springer 08/03/2020, 9:03 PM

## 2020-08-03 NOTE — Anesthesia Preprocedure Evaluation (Addendum)
Anesthesia Evaluation  Patient identified by MRN, date of birth, ID band Patient awake    Reviewed: Allergy & Precautions, NPO status , Patient's Chart, lab work & pertinent test results  History of Anesthesia Complications Negative for: history of anesthetic complications  Airway Mallampati: I   Neck ROM: Full    Dental  (+) Teeth Intact   Pulmonary neg pulmonary ROS,    Pulmonary exam normal        Cardiovascular negative cardio ROS Normal cardiovascular exam     Neuro/Psych negative neurological ROS  negative psych ROS   GI/Hepatic negative GI ROS, Neg liver ROS,   Endo/Other  diabetes, Gestational Hashimoto's thyroiditis   Renal/GU negative Renal ROS     Musculoskeletal negative musculoskeletal ROS (+)   Abdominal   Peds  Hematology  (+) anemia ,  Plt 166k    Anesthesia Other Findings Mild cough related to previous covid infection   Reproductive/Obstetrics (+) Pregnancy                            Anesthesia Physical Anesthesia Plan  ASA: 2  Anesthesia Plan: Spinal   Post-op Pain Management:    Induction:   PONV Risk Score and Plan: 2 and Treatment may vary due to age or medical condition, Ondansetron and Scopolamine patch - Pre-op  Airway Management Planned: Natural Airway  Additional Equipment: None  Intra-op Plan:   Post-operative Plan:   Informed Consent: I have reviewed the patients History and Physical, chart, labs and discussed the procedure including the risks, benefits and alternatives for the proposed anesthesia with the patient or authorized representative who has indicated his/her understanding and acceptance.       Plan Discussed with: CRNA and Anesthesiologist  Anesthesia Plan Comments: (Labs reviewed, platelets acceptable. Discussed risks and benefits of spinal, including spinal/epidural hematoma, infection, failed block, and PDPH. Patient  expressed understanding and wished to proceed. )       Anesthesia Quick Evaluation

## 2020-08-03 NOTE — Transfer of Care (Signed)
Immediate Anesthesia Transfer of Care Note  Patient: Rebecca Cooley  Procedure(s) Performed: Primary CESAREAN SECTION (Abdomen)  Patient Location: PACU  Anesthesia Type:Spinal  Level of Consciousness: awake and alert   Airway & Oxygen Therapy: Patient Spontanous Breathing  Post-op Assessment: Report given to RN and Post -op Vital signs reviewed and stable  Post vital signs: Reviewed and stable  Last Vitals:  Vitals Value Taken Time  BP 118/63 08/03/20 1532  Temp    Pulse 84 08/03/20 1536  Resp 21 08/03/20 1536  SpO2 100 % 08/03/20 1536  Vitals shown include unvalidated device data.  Last Pain:  Vitals:   08/03/20 1329  TempSrc: Oral         Complications: No notable events documented.

## 2020-08-03 NOTE — Anesthesia Procedure Notes (Signed)
Spinal  Patient location during procedure: OR Start time: 08/03/2020 2:30 PM End time: 08/03/2020 2:33 PM Reason for block: surgical anesthesia Staffing Performed: anesthesiologist  Anesthesiologist: Beryle Lathe, MD Preanesthetic Checklist Completed: patient identified, IV checked, risks and benefits discussed, surgical consent, monitors and equipment checked, pre-op evaluation and timeout performed Spinal Block Patient position: sitting Prep: DuraPrep Patient monitoring: heart rate, cardiac monitor, continuous pulse ox and blood pressure Approach: midline Location: L3-4 Injection technique: single-shot Needle Needle type: Pencan  Needle gauge: 24 G Additional Notes Consent was obtained prior to the procedure with all questions answered and concerns addressed. Risks including, but not limited to, bleeding, infection, nerve damage, paralysis, failed block, inadequate analgesia, allergic reaction, high spinal, itching, and headache were discussed and the patient wished to proceed. Functioning IV was confirmed and monitors were applied. Sterile prep and drape, including hand hygiene, mask, and sterile gloves were used. The patient was positioned and the spine was prepped. The skin was anesthetized with lidocaine. Free flow of clear CSF was obtained prior to injecting local anesthetic into the CSF. The spinal needle aspirated freely following injection. The needle was carefully withdrawn. The patient tolerated the procedure well.   Leslye Peer, MD

## 2020-08-03 NOTE — Op Note (Signed)
Cesarean Section Procedure Note  Indications: malpresentation: frank breech  Pre-operative Diagnosis: 39 week 2 day pregnancy.  Post-operative Diagnosis: same  Surgeon: Lenoard Aden   Assistants: Renae Fickle, CNM  Anesthesia: Local anesthesia 0.25.% bupivacaine and Spinal anesthesia  ASA Class: 2  Procedure Details  The patient was seen in the Holding Room. The risks, benefits, complications, treatment options, and expected outcomes were discussed with the patient.  The patient concurred with the proposed plan, giving informed consent. The risks of anesthesia, infection, bleeding and possible injury to other organs discussed. Injury to bowel, bladder, or ureter with possible need for repair discussed. Possible need for transfusion with secondary risks of hepatitis or HIV acquisition discussed. Post operative complications to include but not limited to DVT, PE and Pneumonia noted. The site of surgery properly noted/marked. The patient was taken to Operating Room # C, identified as Rebecca Cooley and the procedure verified as C-Section Delivery. A Time Out was held and the above information confirmed.  After induction of anesthesia, the patient was draped and prepped in the usual sterile manner. A Pfannenstiel incision was made and carried down through the subcutaneous tissue to the fascia. Fascial incision was made and extended transversely using Mayo scissors. The fascia was separated from the underlying rectus tissue superiorly and inferiorly. The peritoneum was identified and entered. Peritoneal incision was extended longitudinally. The utero-vesical peritoneal reflection was incised transversely and the bladder flap was bluntly freed from the lower uterine segment. A low transverse uterine incision(Kerr hysterotomy) was made. Delivered from frank breech presentation was a  female with Apgar scores of 9 at one minute and 9 at five minutes. Bulb suctioning gently performed. Neonatal team in  attendance.After the umbilical cord was clamped and cut cord blood was obtained for evaluation. The placenta was removed intact and appeared normal. The uterus was curetted with a dry lap pack. Good hemostasis was noted.The uterine outline, tubes and ovaries appeared normal. The uterine incision was closed with running locked sutures of 0 Monocryl x 2 layers. Hemostasis was observed.The parietal peritoneum was closed with a running 2-0 Monocryl suture. The fascia was then reapproximated with running sutures of 0 Monocryl. The skin was reapproximated with 4-0 vicryl after [place] closure with 2-0 plain.  Instrument, sponge, and needle counts were correct prior the abdominal closure and at the conclusion of the case.   Findings: FTLM, frank breech, clear af, post placenta, nl uterine contour and cavity, short cord, nl adnexa  Estimated Blood Loss:   500         Drains: foley                 Specimens: placenta                 Complications:  None; patient tolerated the procedure well.         Disposition: PACU - hemodynamically stable.         Condition: stable  Attending Attestation: I performed the procedure.

## 2020-08-03 NOTE — Anesthesia Postprocedure Evaluation (Signed)
Anesthesia Post Note  Patient: Rebecca Cooley  Procedure(s) Performed: Primary CESAREAN SECTION (Abdomen)     Patient location during evaluation: PACU Anesthesia Type: Spinal Level of consciousness: awake and alert Pain management: pain level controlled Vital Signs Assessment: post-procedure vital signs reviewed and stable Respiratory status: spontaneous breathing and respiratory function stable Cardiovascular status: blood pressure returned to baseline and stable Postop Assessment: spinal receding and no apparent nausea or vomiting Anesthetic complications: no   No notable events documented.  Last Vitals:  Vitals:   08/03/20 1645 08/03/20 1658  BP: 128/80   Pulse: 73 76  Resp: 16 17  Temp: 36.6 C   SpO2: 100% 100%    Last Pain:  Vitals:   08/03/20 1658  TempSrc:   PainSc: 0-No pain   Pain Goal:    LLE Motor Response: Purposeful movement (08/03/20 1658) LLE Sensation: Tingling (08/03/20 1658) RLE Motor Response: Purposeful movement (08/03/20 1658) RLE Sensation: Tingling (08/03/20 1658)     Epidural/Spinal Function Cutaneous sensation: Tingles (08/03/20 1658), Patient able to flex knees: No (08/03/20 1658), Patient able to lift hips off bed: No (08/03/20 1658), Back pain beyond tenderness at insertion site: No (08/03/20 1658), Progressively worsening motor and/or sensory loss: No (08/03/20 1658), Bowel and/or bladder incontinence post epidural: No (08/03/20 1658)  Beryle Lathe

## 2020-08-03 NOTE — H&P (Addendum)
Rebecca Cooley is a 35 y.o. female presenting for primary csection for breech . Declined ECV. OB History     Gravida  2   Para  2   Term  1   Preterm      AB      Living  1      SAB      IAB      Ectopic      Multiple  1   Live Births  1          Past Medical History:  Diagnosis Date   Chest pain 08/29/2017   Gestational diabetes    Thyroid disease    No past surgical history on file. Family History: family history includes Dementia in her father; Diabetes in her maternal grandmother and mother; Hypertension in her mother. Social History:  reports that she has never smoked. She has never used smokeless tobacco. She reports previous alcohol use of about 2.0 - 3.0 standard drinks of alcohol per week. She reports that she does not use drugs.     Maternal Diabetes: GDM diet controlled Genetic Screening: Normal Maternal Ultrasounds/Referrals: Normal Fetal Ultrasounds or other Referrals:  None Maternal Substance Abuse:  No Significant Maternal Medications:  None Significant Maternal Lab Results:  Group B Strep negative Other Comments:  None  Review of Systems  Constitutional: Negative.   All other systems reviewed and are negative. Maternal Medical History:  Reason for admission: Contractions.   Contractions: Onset was 3-5 hours ago.   Frequency: irregular.   Perceived severity is mild.   Fetal activity: Perceived fetal activity is normal.   Last perceived fetal movement was within the past hour.   Prenatal complications: no prenatal complications Prenatal Complications - Diabetes: gestational.    unknown if currently breastfeeding. Maternal Exam:  Uterine Assessment: Contraction strength is mild.  Contraction frequency is irregular and rare.  Abdomen: Patient reports no abdominal tenderness. Fetal presentation: vertex Introitus: Normal vulva. Normal vagina.  Ferning test: not done.  Nitrazine test: not done. Amniotic fluid character: not  assessed. Pelvis: questionable for delivery.   Cervix: Cervix evaluated by digital exam.    Physical Exam Constitutional:      Appearance: Normal appearance. She is normal weight.  HENT:     Head: Normocephalic and atraumatic.  Cardiovascular:     Rate and Rhythm: Normal rate and regular rhythm.     Pulses: Normal pulses.     Heart sounds: Normal heart sounds.  Pulmonary:     Effort: Pulmonary effort is normal.     Breath sounds: Normal breath sounds.  Abdominal:     General: Bowel sounds are normal.     Palpations: Abdomen is soft.  Genitourinary:    General: Normal vulva.  Musculoskeletal:        General: Normal range of motion.     Cervical back: Normal range of motion and neck supple.  Skin:    General: Skin is warm and dry.  Neurological:     General: No focal deficit present.     Mental Status: She is alert and oriented to person, place, and time.  Psychiatric:        Mood and Affect: Mood normal.        Behavior: Behavior normal.    Prenatal labs: ABO, Rh: --/--/A POS (06/21 1015) Antibody: NEG (06/21 1015) Rubella:  imm RPR: NON REACTIVE (06/21 1008)  HBsAg:   neg HIV:   neg GBS:   neg  Assessment/Plan: Term  IUP GDM Breech- declined ECV. Confirmed on bedside sono today.  Primary csection. Risks/benefits of surgery discussed. Consent done.    Jadasia Haws J 08/03/2020, 7:57 AM

## 2020-08-04 DIAGNOSIS — O9902 Anemia complicating childbirth: Secondary | ICD-10-CM | POA: Diagnosis not present

## 2020-08-04 LAB — CBC
HCT: 29.6 % — ABNORMAL LOW (ref 36.0–46.0)
Hemoglobin: 9.8 g/dL — ABNORMAL LOW (ref 12.0–15.0)
MCH: 30.5 pg (ref 26.0–34.0)
MCHC: 33.1 g/dL (ref 30.0–36.0)
MCV: 92.2 fL (ref 80.0–100.0)
Platelets: 146 10*3/uL — ABNORMAL LOW (ref 150–400)
RBC: 3.21 MIL/uL — ABNORMAL LOW (ref 3.87–5.11)
RDW: 13 % (ref 11.5–15.5)
WBC: 14.8 10*3/uL — ABNORMAL HIGH (ref 4.0–10.5)
nRBC: 0 % (ref 0.0–0.2)

## 2020-08-04 MED ORDER — MAGNESIUM OXIDE -MG SUPPLEMENT 400 (240 MG) MG PO TABS
400.0000 mg | ORAL_TABLET | Freq: Every day | ORAL | Status: DC
  Administered 2020-08-04 – 2020-08-06 (×3): 400 mg via ORAL
  Filled 2020-08-04 (×3): qty 1

## 2020-08-04 MED ORDER — POLYSACCHARIDE IRON COMPLEX 150 MG PO CAPS
150.0000 mg | ORAL_CAPSULE | Freq: Every day | ORAL | Status: DC
  Administered 2020-08-04 – 2020-08-06 (×3): 150 mg via ORAL
  Filled 2020-08-04 (×3): qty 1

## 2020-08-04 NOTE — Progress Notes (Signed)
Subjective: POD# 1 Live born female  Birth Weight: 7 lb 11.5 oz (3500 g) APGAR: 9, 10  Newborn Delivery   Birth date/time: 08/03/2020 14:51:00 Delivery type: C-Section, Low Transverse Trial of labor: No C-section categorization: Primary     Baby name: CJ Delivering provider: Olivia Mackie   Circumcision Yes, complete  Feeding: breast  Pain control at delivery: Spinal   Reports feeling very sore this morning. Hesitant to get out of the bed. Bleeding is minimal. Working on breastfeeding with LCs. Husband at the bedside.   Patient reports tolerating PO.   Pain controlled with acetaminophen, ibuprofen (OTC), and prescription NSAID's including ketorolac (Toradol) Denies HA/SOB/C/P/N/V/dizziness. She reports vaginal bleeding as normal, without clots. She is ambulating and urinating without difficulty.     Objective:   Vitals:   08/03/20 2300 08/04/20 0256 08/04/20 0521 08/04/20 0616  BP: 112/68 118/69 111/66 111/73  Pulse: 78 64 89 63  Resp: 18 17 18 17   Temp: 98.1 F (36.7 C) 98.3 F (36.8 C) 97.9 F (36.6 C) 98.5 F (36.9 C)  TempSrc: Oral Oral Oral Oral  SpO2: 100% 100% 100% 100%  Weight:      Height:         Intake/Output Summary (Last 24 hours) at 08/04/2020 1023 Last data filed at 08/04/2020 08/06/2020 Gross per 24 hour  Intake 2000 ml  Output 3220 ml  Net -1220 ml      Recent Labs    08/04/20 0416  WBC 14.8*  HGB 9.8*  HCT 29.6*  PLT 146*    Blood type: --/--/A POS (06/21 1015)  Rubella: Immune (11/30 0000)   Vaccines:   TDaP          UTD                      COVID-19 Declined  Physical Exam:  General: alert and cooperative CV: Regular rate and rhythm Resp: clear Abdomen: soft, nontender, normal bowel sounds Incision: clean, dry, and pressure dressing in place Uterine Fundus: firm, below umbilicus, nontender Lochia: minimal Ext: extremities normal, atraumatic, no cyanosis or edema and no edema, redness or tenderness in the calves or  thighs  Assessment/Plan: 35 y.o.   POD# 1. 32                  Principal Problem:   Postpartum care following cesarean delivery 6/23  Encourage rest when baby rests Breastfeeding support Encourage to ambulate Routine post-op care Remove pressure dressing Active Problems:   History of thyroiditis  No current meds or symptoms  Close PP F/U   Gestational diabetes mellitus (GDM), antepartum  Blood sugars WNL postpartum  F/U PP    Cesarean delivery / Breech   Maternal anemia, with delivery  Asymptomatic  Start PO Niferex and mag oxide  7/23, MSN 08/04/2020, 10:23 AM

## 2020-08-05 NOTE — Lactation Note (Addendum)
This note was copied from a baby's chart. Lactation Consultation Note  Patient Name: Rebecca Cooley ACZYS'A Date: 08/05/2020 Reason for consult: Follow-up assessment;Mother's request;Difficult latch Age:34 hours, -5% weight loss, on billi lights. Per mom, she wants to BF infant she has mostly been formula feeding infant due to infant not latching at the breast. LC observe both of mom's nipples are inverted and infant has restricted range of tongue motion, tight bands of lingual frenulum. Per mom ,she feels she doesn't have any colostrum in breast not notice any EBM when she attempted to use the DEBP. LC explained colostrum is thick and purpose of using the DEBP is to help stimulate breast to make milk, mom will start pumping every 3 hours for 15 minutes. LC reviewed hand expression and mom expressed 10 mls of colostrum that was put in curve tip syringe. LC ask mom to do breast stimulation prior to latching infant on her right breast, after few multiple attempts, mom fitted with 24 mm NS and NS was pre-filled with 0.5 mls of mom's EBM. Infant was supplemented at the breast using the curve tip syringe and took 10 mls of mom's EBM, sustained latch and breastfeed for 20 minutes with 24 mm NS. Mom was happy infant is now latching at the breast. Afterwards dad supplemented infant with 15 mls of formula pace feeding infant while mom used the DEBP. Mom's plan: 1- Mom will pre-pump breast with hand pump and apply 24 mm NS prior to latching infant at the breast, BF infant according to hunger cues, 8 to 12+ times within 24 hours, STS. 2- Mom will supplement infant at the breast  with curve tip syringe or use slow flow bottle nipple giving infant her EBM first that is pumped or hand express before supplementing infant with formula. 3- Parents understand on day 3 infant should be supplemented with total volume of 18 to 25 mls, due infant's high  billi parents will offer total of  25 mls EBM/ formula after  mom latches infant at the breast. 4- Mom will use DEBP every 3 hours for 15 minutes on initial setting. 5- Mom knows to call RN or LC if she needs assistance with latching infant at the breast or applying 24 mm NS.   Maternal Data Has patient been taught Hand Expression?: Yes  Feeding Mother's Current Feeding Choice: Breast Milk and Formula  LATCH Score Latch: Grasps breast easily, tongue down, lips flanged, rhythmical sucking.  Audible Swallowing: Spontaneous and intermittent  Type of Nipple: Inverted  Comfort (Breast/Nipple): Soft / non-tender  Hold (Positioning): Assistance needed to correctly position infant at breast and maintain latch.  LATCH Score: 7   Lactation Tools Discussed/Used Tools: Pump Breast pump type: Double-Electric Breast Pump Pump Education: Setup, frequency, and cleaning;Milk Storage Reason for Pumping: Infant been poor latcher, mom mostly has  been formula feeding infant, infant with high billi, limited  range of motion ( tight bands of linigual frenulum) and mom has inverted nipples both breast. Pumping frequency: Mom plans to start usng DEBP every 3 hours for 15 minutes on inital setting as previously advised. Mom was pumping as LC left the room.  Interventions Interventions: Skin to skin;Hand express;Breast compression;Adjust position;Support pillows;Position options;Expressed milk;DEBP;Shells  Discharge    Consult Status Consult Status: Follow-up Date: 08/06/20 Follow-up type: In-patient    Danelle Earthly 08/05/2020, 5:53 PM

## 2020-08-05 NOTE — Lactation Note (Signed)
This note was copied from a baby's chart. Lactation Consultation Note  Patient Name: Rebecca Cooley GURKY'H Date: 08/05/2020 Reason for consult: Follow-up assessment Age:34 hours   P2 mother whose infant is now 63 hours old.  This is a term baby at 40+0 weeks.  Mother did not breast feed her first child.  Mother has been primarily formula feeding.  RN requested latch assistance.  Baby was asleep STS on mother's chest when I arrived.  Mother had just finished formula feeding.  Reviewed feeding plan for today encouraging mother to call for latch assistance prior to giving formula supplementation.  Discussed breast feeding basics including milk "coming to volume."  Mother concerned that she has started pumping with the DEBP but is not seeing any colostrum at this time.  Reassurance provided and suggested mother continue to breast feed, supplement and pump.  Suggested she call when she sees feeding cues and I will return for a latch assist.  Mother willing to do this.  Baby is jaundiced.  Noted last lab value to be 12.9 mg/dl at 38 hours of life.  Mother stated that the lab tech has been in for a morning lab draw; will continue to follow.  Explained jaundice to mother.  Support person present.   Maternal Data    Feeding    LATCH Score                    Lactation Tools Discussed/Used    Interventions    Discharge    Consult Status Consult Status: Follow-up Date: 08/05/20 Follow-up type: In-patient    Gresia Isidoro R Addasyn Mcbreen 08/05/2020, 6:26 AM

## 2020-08-05 NOTE — Progress Notes (Signed)
Subjective: POD# 2 Live born female  Birth Weight: 7 lb 11.5 oz (3500 g) APGAR: 9, 10  Newborn Delivery   Birth date/time: 08/03/2020 14:51:00 Delivery type: C-Section, Low Transverse Trial of labor: No C-section categorization: Primary     Baby name: CJ Delivering provider: Olivia Mackie   Circumcision Yes, complete  Feeding: breast and bottle  Pain control at delivery: Spinal   Reports feeling much better this morning. Pain is minimal. Able to ambulate without assistance. Baby is under bili lights. Working on breastfeeding and supplementing with formula. Husband at the bedside.   Patient reports tolerating PO.   Pain controlled with acetaminophen, ibuprofen (OTC), and narcotic analgesics including oxycodone (Oxycontin, Oxyir) Denies HA/SOB/C/P/N/V/dizziness. She reports vaginal bleeding as normal, without clots. She is ambulating and urinating without difficulty.     Objective:   Vitals:   08/04/20 0521 08/04/20 0616 08/04/20 2345 08/05/20 0542  BP: 111/66 111/73 111/73 107/68  Pulse: 89 63 83 82  Resp: 18 17 18 18   Temp: 97.9 F (36.6 C) 98.5 F (36.9 C) 98.4 F (36.9 C) 98.2 F (36.8 C)  TempSrc: Oral Oral Oral Oral  SpO2: 100% 100% 100% 100%  Weight:      Height:        Recent Labs    08/04/20 0416  WBC 14.8*  HGB 9.8*  HCT 29.6*  PLT 146*    Blood type: --/--/A POS (06/21 1015)  Rubella: Immune (11/30 0000)   Vaccines:   TDaP          UTD                      COVID-19 Declined  Physical Exam:  General: alert, cooperative, and appears stated age CV: Regular rate and rhythm Resp: clear Abdomen: soft, nontender, normal bowel sounds Incision: clean, dry, and intact Uterine Fundus: firm, below umbilicus, nontender Lochia: minimal Ext: extremities normal, atraumatic, no cyanosis or edema and no edema, redness or tenderness in the calves or thighs  Assessment/Plan: 35 y.o.   POD# 2. 32                  Principal Problem:   Postpartum care  following cesarean delivery 6/23            Encourage rest when baby rests Breastfeeding support Encourage to ambulate Active Problems:   History of thyroiditis             No current meds or symptoms             Close PP F/U   Gestational diabetes mellitus (GDM), antepartum             Blood sugars WNL postpartum             F/U PP   Cesarean delivery / Breech   Maternal anemia, with delivery             Asymptomatic             Continue PO Niferex and mag oxide  Anticipate discharge tomorrow.   7/23, CNM, MSN 08/05/2020, 9:47 AM

## 2020-08-06 MED ORDER — POLYSACCHARIDE IRON COMPLEX 150 MG PO CAPS: 150.0000 mg | ORAL_CAPSULE | Freq: Every day | ORAL | 2 refills | Status: DC

## 2020-08-06 MED ORDER — OXYCODONE HCL 5 MG PO TABS: 5.0000 mg | ORAL_TABLET | ORAL | 0 refills | Status: DC | PRN

## 2020-08-06 MED ORDER — IBUPROFEN 600 MG PO TABS: 600.0000 mg | ORAL_TABLET | Freq: Four times a day (QID) | ORAL | 0 refills | Status: DC

## 2020-08-06 NOTE — Progress Notes (Signed)
No n/v, tol po; ambulating; +flatus; voiding w/o difficulty; breastfeeding Nml lochia; does c/o slight head pressure, seems starting from back of neck, started last night  BP 115/76 (BP Location: Left Arm)   Pulse 86   Temp 98.5 F (36.9 C) (Oral)   Resp 16   Ht 5\' 1"  (1.549 m)   Wt 71.7 kg   SpO2 100%   Breastfeeding Unknown   BMI 29.85 kg/m   A&ox3 Rrr Ctab Abd: +bs; soft, nt,n d; dressing: c/d/i LE: no edema, nt bilat  CBC Latest Ref Rng & Units 08/04/2020 08/01/2020 07/01/2018  WBC 4.0 - 10.5 K/uL 14.8(H) 8.7 -  Hemoglobin 12.0 - 15.0 g/dL 07/03/2018) 11.8(L) 13.1  Hematocrit 36.0 - 46.0 % 29.6(L) 35.8(L) 38.0  Platelets 150 - 400 K/uL 146(L) 166 -   A/P: pod 3 s/p 1ltcs/breech Doing well, d/c home; f/u in 6 wks Chronic anemiaa with acute change- plan iron daily, asymptomatic Rh pos RI Gdma1-bs nml pp, f/u pp Mild tension h/a, m/s pain - tylenol/ibuprofen prn, but encourage massage, likely d/t how she slept last night

## 2020-08-06 NOTE — Discharge Summary (Signed)
Postpartum Discharge Summary  Date of Service updated     Patient Name: Rebecca Cooley DOB: Jul 06, 1985 MRN: [id number]  Date of admission: 08/03/2020 Delivery date:08/03/2020  Delivering provider: Olivia Mackie  Date of discharge: 08/06/2020  Admitting diagnosis: Breech birth [O32.1XX0] Intrauterine pregnancy: [redacted]w[redacted]d     Secondary diagnosis:  Principal Problem:   Postpartum care following cesarean delivery 6/23 Active Problems:   History of thyroiditis   Gestational diabetes mellitus (GDM), antepartum   Cesarean delivery / Breech   Maternal anemia, with delivery  Additional problems: none    Discharge diagnosis: Term Pregnancy Delivered, GDM A1, and Anemia                                              Post partum procedures: none Complications: None  Hospital course: Sceduled C/S   35 y.o. yo G2P1002 at [redacted]w[redacted]d was admitted to the hospital 08/03/2020 for scheduled cesarean section with the following indication:Malpresentation.Delivery details are as follows:  Membrane Rupture Time/Date: 2:52 PM ,08/03/2020   Delivery Method:C-Section, Low Transverse  Details of operation can be found in separate operative note.  Patient had an uncomplicated postpartum course.  She is ambulating, tolerating a regular diet, passing flatus, and urinating well. Patient is discharged home in stable condition on  08/06/20        Newborn Data: Birth date:08/03/2020  Birth time:2:51 PM  Gender:Female  Living status:Living  Apgars:9 ,10  Weight:3500 g      Physical exam  Vitals:   08/05/20 0542 08/05/20 1300 08/05/20 2102 08/06/20 0606  BP: 107/68 115/66 125/72 115/76  Pulse: 82 (!) 103 88 86  Resp: 18 14 16 16   Temp: 98.2 F (36.8 C) 98.2 F (36.8 C) 98 F (36.7 C) 98.5 F (36.9 C)  TempSrc: Oral Oral Oral Oral  SpO2: 100%  99% 100%  Weight:      Height:       General: alert, cooperative, and no distress Lochia: appropriate Uterine Fundus: firm Incision: Healing well with no  significant drainage DVT Evaluation: No evidence of DVT seen on physical exam. Labs: Lab Results  Component Value Date   WBC 14.8 (H) 08/04/2020   HGB 9.8 (L) 08/04/2020   HCT 29.6 (L) 08/04/2020   MCV 92.2 08/04/2020   PLT 146 (L) 08/04/2020   CMP Latest Ref Rng & Units 08/28/2017  Glucose 70 - 99 mg/dL 94  BUN 6 - 20 mg/dL 9  Creatinine 08/30/2017 - 1.27 mg/dL 5.17  Sodium 0.01 - 749 mmol/L 140  Potassium 3.5 - 5.1 mmol/L 3.9  Chloride 98 - 111 mmol/L 105  CO2 20 - 29 mmol/L -  Calcium 8.7 - 10.2 mg/dL -  Total Protein 6.0 - 8.3 g/dL -  Total Bilirubin 0.2 - 1.2 mg/dL -  Alkaline Phos 39 - 449 U/L -  AST 0 - 37 U/L -  ALT 0 - 35 U/L -   Edinburgh Score: Edinburgh Postnatal Depression Scale Screening Tool 08/03/2020  I have been able to laugh and see the funny side of things. 0  I have looked forward with enjoyment to things. 0  I have blamed myself unnecessarily when things went wrong. 0  I have been anxious or worried for no good reason. 2  I have felt scared or panicky for no good reason. 1  Things have been getting on top of  me. 0  I have been so unhappy that I have had difficulty sleeping. 0  I have felt sad or miserable. 0  I have been so unhappy that I have been crying. 0  The thought of harming myself has occurred to me. 0  Edinburgh Postnatal Depression Scale Total 3      After visit meds:  Allergies as of 08/06/2020   No Known Allergies      Medication List     TAKE these medications    ibuprofen 600 MG tablet Commonly known as: ADVIL Take 1 tablet (600 mg total) by mouth every 6 (six) hours.   iron polysaccharides 150 MG capsule Commonly known as: NIFEREX Take 1 capsule (150 mg total) by mouth daily.   oxyCODONE 5 MG immediate release tablet Commonly known as: Roxicodone Take 1 tablet (5 mg total) by mouth every 4 (four) hours as needed for severe pain.   prenatal multivitamin Tabs tablet Take 1 tablet by mouth at bedtime.         Discharge  home in stable condition Infant Feeding: Bottle Infant Disposition:home with mother Discharge instruction: per After Visit Summary and Postpartum booklet. Activity: Advance as tolerated. Pelvic rest for 6 weeks.  Diet: carb modified diet Anticipated Birth Control: Unsure Postpartum Appointment:6 weeks Additional Postpartum F/U:  none Future Appointments:No future appointments. Follow up Visit:      08/06/2020 Vick Frees, MD

## 2020-08-06 NOTE — Lactation Note (Signed)
This note was copied from a baby's chart. Lactation Consultation Note  Patient Name: Boy Brazil Voytko LAGTX'M Date: 08/06/2020 Reason for consult: Follow-up assessment Age:34 hours   P2 mother whose infant is now 59 hours old.  This is a term baby at 40+0 weeks.  Mother did not breast feed her first child.  Mother's current feeding preference is breast/formula.  Bilirubin level this morning was 14 mg/dl at 63 hours of life (High intermediate risk zone).  MD in room at the end of my visit to confirm and inform parents that baby will be discharged today.  Mother will continue to feed on cue and supplement as needed.  She has been pumping and obtaining colostrum drops.  She is also using the NS due to nipple inversion.  Encouraged to continue pumping after every feeding.  Engorgement prevention/treatment reviewed.  She has our OP phone number for any questions after discharge.  Father present.   Maternal Data    Feeding    LATCH Score                    Lactation Tools Discussed/Used    Interventions    Discharge Discharge Education: Engorgement and breast care  Consult Status Consult Status: Complete Date: 08/06/20 Follow-up type: Call as needed    Aubery Douthat R Fraser Busche 08/06/2020, 8:18 AM

## 2020-08-15 ENCOUNTER — Telehealth (HOSPITAL_COMMUNITY): Payer: Self-pay

## 2020-08-15 NOTE — Telephone Encounter (Signed)
"  We are doing good. Is it normal to have some puffieness around my incision?" RN reviewed signs of infection and incision care with patient. Patient declines reddness, yellow/green puss, pain,or foul odor at incision site. No fever. "She states that the puffieness at the incision is actually looking better today." RN told patient to look at her incision everyday and watch for signs of infection and if she is to have any to report it to her OB. She reports that her honeycomb dressing is off and the steri strips. Patient declines any other questions or concerns about her healing.  "He is good. We are working on getting in a consistent routine. His jaundice is looking a lot better. He is having lots of diapers. He is feeding well. He has a tongue tie so I'm pumping and bottle feeding. We are supplementing with some formula as well. We are working on getting the tongue tie fixed." Mother declines any questions or concerns about baby.  EPDS score 0  Heron Nay 08/15/2020,1543

## 2020-09-15 ENCOUNTER — Encounter (HOSPITAL_COMMUNITY): Payer: Self-pay | Admitting: Obstetrics and Gynecology

## 2020-09-15 DIAGNOSIS — Z124 Encounter for screening for malignant neoplasm of cervix: Secondary | ICD-10-CM | POA: Diagnosis not present

## 2021-02-27 LAB — OB RESULTS CONSOLE ANTIBODY SCREEN: Antibody Screen: NEGATIVE

## 2021-04-02 LAB — OB RESULTS CONSOLE RPR: RPR: NONREACTIVE

## 2021-04-02 LAB — OB RESULTS CONSOLE RUBELLA ANTIBODY, IGM: Rubella: IMMUNE

## 2021-04-02 LAB — OB RESULTS CONSOLE HEPATITIS B SURFACE ANTIGEN: Hepatitis B Surface Ag: NEGATIVE

## 2021-04-02 LAB — OB RESULTS CONSOLE HIV ANTIBODY (ROUTINE TESTING): HIV: NONREACTIVE

## 2021-04-18 LAB — OB RESULTS CONSOLE GC/CHLAMYDIA
Chlamydia: NEGATIVE
Neisseria Gonorrhea: NEGATIVE

## 2021-09-27 LAB — OB RESULTS CONSOLE GBS: GBS: NEGATIVE

## 2021-10-08 ENCOUNTER — Telehealth (HOSPITAL_COMMUNITY): Payer: Self-pay | Admitting: *Deleted

## 2021-10-08 ENCOUNTER — Encounter (HOSPITAL_COMMUNITY): Payer: Self-pay | Admitting: *Deleted

## 2021-10-08 NOTE — Telephone Encounter (Signed)
Preadmission screen  

## 2021-10-08 NOTE — Patient Instructions (Addendum)
Rebecca Cooley  10/08/2021   Your procedure is scheduled on:  10/21/2021  Arrive at 0945 at Entrance C on [address] at [facility] and [employer].  You are invited to use the FREE valet parking or use the Visitor's parking deck.  Pick up the phone at the desk and dial [phone].  Call this number if you have problems the morning of surgery: [phone]   Remember:   Do not eat food:(After Midnight) Desps de medianoche.  Do not drink clear liquids: (After Midnight) Desps de medianoche.  Take these medicines the morning of surgery with A SIP OF WATER:  Take levothyroxine as prescribed   Do not wear jewelry, make-up or nail polish.  Do not wear lotions, powders, or perfumes. Do not wear deodorant.  Do not shave 48 hours prior to surgery.  Do not bring valuables to the hospital.  [facility] is not   responsible for any belongings or valuables brought to the hospital.  Contacts, dentures or bridgework may not be worn into surgery.  Leave suitcase in the car. After surgery it may be brought to your room.  For patients admitted to the hospital, checkout time is 11:00 AM the day of              discharge.      Please read over the following fact sheets that you were given: Preparing for Surgery

## 2021-10-09 ENCOUNTER — Other Ambulatory Visit: Payer: Self-pay | Admitting: Obstetrics and Gynecology

## 2021-10-10 ENCOUNTER — Encounter (HOSPITAL_COMMUNITY): Payer: Self-pay

## 2021-10-19 ENCOUNTER — Encounter (HOSPITAL_COMMUNITY)
Admission: RE | Admit: 2021-10-19 | Discharge: 2021-10-19 | Disposition: A | Discharge status: Home / Self Care | Payer: BC Managed Care – PPO | Source: Ambulatory Visit | Attending: Obstetrics and Gynecology | Admitting: Obstetrics and Gynecology

## 2021-10-19 HISTORY — DX: Hypothyroidism, unspecified: E03.9

## 2021-10-19 LAB — CBC
HCT: 35 % — ABNORMAL LOW (ref 36.0–46.0)
Hemoglobin: 11.5 g/dL — ABNORMAL LOW (ref 12.0–15.0)
MCH: 30.2 pg (ref 26.0–34.0)
MCHC: 32.9 g/dL (ref 30.0–36.0)
MCV: 91.9 fL (ref 80.0–100.0)
Platelets: 184 10*3/uL (ref 150–400)
RBC: 3.81 MIL/uL — ABNORMAL LOW (ref 3.87–5.11)
RDW: 13.4 % (ref 11.5–15.5)
WBC: 8.2 10*3/uL (ref 4.0–10.5)
nRBC: 0 % (ref 0.0–0.2)

## 2021-10-19 LAB — TYPE AND SCREEN
ABO/RH(D): A POS
Antibody Screen: NEGATIVE

## 2021-10-20 LAB — RPR: RPR Ser Ql: NONREACTIVE

## 2021-10-21 ENCOUNTER — Encounter (HOSPITAL_COMMUNITY)
Admission: RE | Disposition: A | Discharge status: Home / Self Care | Payer: Self-pay | Source: Home / Self Care | Attending: Obstetrics and Gynecology

## 2021-10-21 ENCOUNTER — Encounter (HOSPITAL_COMMUNITY): Payer: Self-pay | Admitting: Obstetrics and Gynecology

## 2021-10-21 ENCOUNTER — Other Ambulatory Visit: Payer: Self-pay

## 2021-10-21 ENCOUNTER — Inpatient Hospital Stay (HOSPITAL_COMMUNITY)
Admission: RE | Admit: 2021-10-21 | Discharge: 2021-10-23 | DRG: 788 | Disposition: A | Discharge status: Home / Self Care | Payer: BC Managed Care – PPO | Attending: Obstetrics and Gynecology | Admitting: Obstetrics and Gynecology

## 2021-10-21 ENCOUNTER — Inpatient Hospital Stay (HOSPITAL_COMMUNITY): Payer: BC Managed Care – PPO | Admitting: Anesthesiology

## 2021-10-21 DIAGNOSIS — Z8639 Personal history of other endocrine, nutritional and metabolic disease: Secondary | ICD-10-CM

## 2021-10-21 DIAGNOSIS — O34211 Maternal care for low transverse scar from previous cesarean delivery: Principal | ICD-10-CM | POA: Diagnosis present

## 2021-10-21 DIAGNOSIS — O34219 Maternal care for unspecified type scar from previous cesarean delivery: Secondary | ICD-10-CM | POA: Diagnosis present

## 2021-10-21 DIAGNOSIS — Z3A39 39 weeks gestation of pregnancy: Secondary | ICD-10-CM

## 2021-10-21 DIAGNOSIS — E039 Hypothyroidism, unspecified: Secondary | ICD-10-CM | POA: Diagnosis present

## 2021-10-21 DIAGNOSIS — O99284 Endocrine, nutritional and metabolic diseases complicating childbirth: Secondary | ICD-10-CM | POA: Diagnosis present

## 2021-10-21 DIAGNOSIS — Z98891 History of uterine scar from previous surgery: Secondary | ICD-10-CM

## 2021-10-21 SURGERY — Surgical Case
Anesthesia: Spinal

## 2021-10-21 MED ORDER — DEXAMETHASONE SODIUM PHOSPHATE 4 MG/ML IJ SOLN
INTRAMUSCULAR | Status: AC
  Filled 2021-10-21: qty 1

## 2021-10-21 MED ORDER — MEPERIDINE HCL 25 MG/ML IJ SOLN: 6.2500 mg | INTRAMUSCULAR | Status: DC | PRN

## 2021-10-21 MED ORDER — CEFAZOLIN SODIUM-DEXTROSE 2-4 GM/100ML-% IV SOLN
2.0000 g | INTRAVENOUS | Status: AC
  Administered 2021-10-21: 2 g via INTRAVENOUS

## 2021-10-21 MED ORDER — MORPHINE SULFATE (PF) 0.5 MG/ML IJ SOLN
INTRAMUSCULAR | Status: DC | PRN
  Administered 2021-10-21 (×2): 150 ug via INTRATHECAL

## 2021-10-21 MED ORDER — BUPIVACAINE IN DEXTROSE 0.75-8.25 % IT SOLN
INTRATHECAL | Status: DC | PRN
  Administered 2021-10-21 (×2): 1.5 mL via INTRATHECAL

## 2021-10-21 MED ORDER — DIPHENHYDRAMINE HCL 25 MG PO CAPS: 25.0000 mg | ORAL_CAPSULE | Freq: Four times a day (QID) | ORAL | Status: DC | PRN

## 2021-10-21 MED ORDER — SODIUM CHLORIDE 0.9 % IR SOLN
Status: DC | PRN
  Administered 2021-10-21: 1

## 2021-10-21 MED ORDER — TETANUS-DIPHTH-ACELL PERTUSSIS 5-2.5-18.5 LF-MCG/0.5 IM SUSY: 0.5000 mL | PREFILLED_SYRINGE | Freq: Once | INTRAMUSCULAR | Status: DC

## 2021-10-21 MED ORDER — SENNOSIDES-DOCUSATE SODIUM 8.6-50 MG PO TABS
2.0000 | ORAL_TABLET | Freq: Every day | ORAL | Status: DC
  Administered 2021-10-22 – 2021-10-23 (×2): 2 via ORAL
  Filled 2021-10-21 (×2): qty 2

## 2021-10-21 MED ORDER — BUPIVACAINE HCL (PF) 0.25 % IJ SOLN
INTRAMUSCULAR | Status: AC
  Filled 2021-10-21: qty 20

## 2021-10-21 MED ORDER — TRANEXAMIC ACID-NACL 1000-0.7 MG/100ML-% IV SOLN
INTRAVENOUS | Status: DC | PRN
  Administered 2021-10-21: 1000 mg via INTRAVENOUS

## 2021-10-21 MED ORDER — ACETAMINOPHEN 10 MG/ML IV SOLN
INTRAVENOUS | Status: AC
  Filled 2021-10-21: qty 100

## 2021-10-21 MED ORDER — MORPHINE SULFATE (PF) 0.5 MG/ML IJ SOLN: INTRAMUSCULAR | Status: DC | PRN

## 2021-10-21 MED ORDER — METHYLERGONOVINE MALEATE 0.2 MG PO TABS: 0.2000 mg | ORAL_TABLET | ORAL | Status: DC | PRN

## 2021-10-21 MED ORDER — OXYTOCIN-SODIUM CHLORIDE 30-0.9 UT/500ML-% IV SOLN
INTRAVENOUS | Status: AC
  Filled 2021-10-21: qty 500

## 2021-10-21 MED ORDER — FENTANYL CITRATE (PF) 100 MCG/2ML IJ SOLN: INTRAMUSCULAR | Status: DC | PRN

## 2021-10-21 MED ORDER — SIMETHICONE 80 MG PO CHEW: 80.0000 mg | CHEWABLE_TABLET | ORAL | Status: DC | PRN

## 2021-10-21 MED ORDER — METHYLERGONOVINE MALEATE 0.2 MG/ML IJ SOLN: 0.2000 mg | INTRAMUSCULAR | Status: DC | PRN

## 2021-10-21 MED ORDER — ONDANSETRON HCL 4 MG/2ML IJ SOLN
4.0000 mg | Freq: Three times a day (TID) | INTRAMUSCULAR | Status: DC | PRN
  Administered 2021-10-21: 4 mg via INTRAVENOUS
  Filled 2021-10-21: qty 2

## 2021-10-21 MED ORDER — STERILE WATER FOR IRRIGATION IR SOLN
Status: DC | PRN
  Administered 2021-10-21: 1000 mL

## 2021-10-21 MED ORDER — BUPIVACAINE HCL (PF) 0.25 % IJ SOLN
INTRAMUSCULAR | Status: DC | PRN
  Administered 2021-10-21: 10 mL

## 2021-10-21 MED ORDER — FENTANYL CITRATE (PF) 100 MCG/2ML IJ SOLN
INTRAMUSCULAR | Status: DC | PRN
Start: 2021-10-21 — End: 2021-10-21
  Administered 2021-10-21 (×2): 15 ug via INTRATHECAL

## 2021-10-21 MED ORDER — LACTATED RINGERS IV SOLN: INTRAVENOUS | Status: DC

## 2021-10-21 MED ORDER — PHENYLEPHRINE HCL (PRESSORS) 10 MG/ML IV SOLN
INTRAVENOUS | Status: DC | PRN
  Administered 2021-10-21: 160 ug via INTRAVENOUS

## 2021-10-21 MED ORDER — ONDANSETRON HCL 4 MG/2ML IJ SOLN
INTRAMUSCULAR | Status: DC | PRN
  Administered 2021-10-21: 4 mg via INTRAVENOUS

## 2021-10-21 MED ORDER — DIPHENHYDRAMINE HCL 25 MG PO CAPS: 25.0000 mg | ORAL_CAPSULE | ORAL | Status: DC | PRN

## 2021-10-21 MED ORDER — DIPHENHYDRAMINE HCL 50 MG/ML IJ SOLN
12.5000 mg | INTRAMUSCULAR | Status: DC | PRN
  Administered 2021-10-21: 12.5 mg via INTRAVENOUS

## 2021-10-21 MED ORDER — POVIDONE-IODINE 10 % EX SWAB
2.0000 | Freq: Once | CUTANEOUS | Status: AC
  Administered 2021-10-21: 2 via TOPICAL

## 2021-10-21 MED ORDER — KETOROLAC TROMETHAMINE 30 MG/ML IJ SOLN
INTRAMUSCULAR | Status: AC
  Filled 2021-10-21: qty 1

## 2021-10-21 MED ORDER — OXYTOCIN-SODIUM CHLORIDE 30-0.9 UT/500ML-% IV SOLN
INTRAVENOUS | Status: DC | PRN
  Administered 2021-10-21: 300 mL via INTRAVENOUS

## 2021-10-21 MED ORDER — KETOROLAC TROMETHAMINE 30 MG/ML IJ SOLN
30.0000 mg | Freq: Four times a day (QID) | INTRAMUSCULAR | Status: AC
  Administered 2021-10-21 – 2021-10-22 (×3): 30 mg via INTRAVENOUS
  Filled 2021-10-21 (×3): qty 1

## 2021-10-21 MED ORDER — PHENYLEPHRINE HCL-NACL 20-0.9 MG/250ML-% IV SOLN
INTRAVENOUS | Status: DC | PRN
  Administered 2021-10-21: 60 ug/min via INTRAVENOUS

## 2021-10-21 MED ORDER — ONDANSETRON HCL 4 MG/2ML IJ SOLN
INTRAMUSCULAR | Status: AC
  Filled 2021-10-21: qty 2

## 2021-10-21 MED ORDER — SOD CITRATE-CITRIC ACID 500-334 MG/5ML PO SOLN
ORAL | Status: AC
  Filled 2021-10-21: qty 30

## 2021-10-21 MED ORDER — MORPHINE SULFATE (PF) 0.5 MG/ML IJ SOLN
INTRAMUSCULAR | Status: AC
  Filled 2021-10-21: qty 10

## 2021-10-21 MED ORDER — NALOXONE HCL 0.4 MG/ML IJ SOLN: 0.4000 mg | INTRAMUSCULAR | Status: DC | PRN

## 2021-10-21 MED ORDER — TRANEXAMIC ACID-NACL 1000-0.7 MG/100ML-% IV SOLN
INTRAVENOUS | Status: AC
  Filled 2021-10-21: qty 100

## 2021-10-21 MED ORDER — SODIUM CHLORIDE (PF) 0.9 % IJ SOLN
INTRAMUSCULAR | Status: AC
  Filled 2021-10-21: qty 10

## 2021-10-21 MED ORDER — WITCH HAZEL-GLYCERIN EX PADS: 1.0000 | MEDICATED_PAD | CUTANEOUS | Status: DC | PRN

## 2021-10-21 MED ORDER — OXYTOCIN-SODIUM CHLORIDE 30-0.9 UT/500ML-% IV SOLN
2.5000 [IU]/h | INTRAVENOUS | Status: AC
  Administered 2021-10-21: 2.5 [IU]/h via INTRAVENOUS
  Filled 2021-10-21: qty 500

## 2021-10-21 MED ORDER — CARBOPROST TROMETHAMINE 250 MCG/ML IM SOLN
INTRAMUSCULAR | Status: AC
  Filled 2021-10-21: qty 1

## 2021-10-21 MED ORDER — DIBUCAINE (PERIANAL) 1 % EX OINT: 1.0000 | TOPICAL_OINTMENT | CUTANEOUS | Status: DC | PRN

## 2021-10-21 MED ORDER — SOD CITRATE-CITRIC ACID 500-334 MG/5ML PO SOLN
30.0000 mL | Freq: Once | ORAL | Status: AC
  Administered 2021-10-21: 30 mL via ORAL

## 2021-10-21 MED ORDER — ACETAMINOPHEN 10 MG/ML IV SOLN
INTRAVENOUS | Status: DC | PRN
  Administered 2021-10-21: 1000 mg via INTRAVENOUS

## 2021-10-21 MED ORDER — SODIUM CHLORIDE 0.9% FLUSH: 3.0000 mL | INTRAVENOUS | Status: DC | PRN

## 2021-10-21 MED ORDER — OXYTOCIN-SODIUM CHLORIDE 30-0.9 UT/500ML-% IV SOLN: INTRAVENOUS | Status: DC | PRN

## 2021-10-21 MED ORDER — KETOROLAC TROMETHAMINE 30 MG/ML IJ SOLN
30.0000 mg | Freq: Four times a day (QID) | INTRAMUSCULAR | Status: DC | PRN
  Administered 2021-10-21: 30 mg via INTRAVENOUS

## 2021-10-21 MED ORDER — NALOXONE HCL 4 MG/10ML IJ SOLN: 1.0000 ug/kg/h | INTRAVENOUS | Status: DC | PRN

## 2021-10-21 MED ORDER — ACETAMINOPHEN 500 MG PO TABS
1000.0000 mg | ORAL_TABLET | Freq: Four times a day (QID) | ORAL | Status: DC
  Administered 2021-10-21 – 2021-10-23 (×6): 1000 mg via ORAL
  Filled 2021-10-21 (×7): qty 2

## 2021-10-21 MED ORDER — PHENYLEPHRINE 80 MCG/ML (10ML) SYRINGE FOR IV PUSH (FOR BLOOD PRESSURE SUPPORT)
PREFILLED_SYRINGE | INTRAVENOUS | Status: AC
  Filled 2021-10-21: qty 10

## 2021-10-21 MED ORDER — OXYCODONE HCL 5 MG PO TABS
5.0000 mg | ORAL_TABLET | ORAL | Status: DC | PRN
  Administered 2021-10-22 – 2021-10-23 (×3): 5 mg via ORAL
  Filled 2021-10-21 (×3): qty 1
  Filled 2021-10-21: qty 2

## 2021-10-21 MED ORDER — LEVOTHYROXINE SODIUM 75 MCG PO TABS
75.0000 ug | ORAL_TABLET | Freq: Every day | ORAL | Status: DC
  Administered 2021-10-22 – 2021-10-23 (×2): 75 ug via ORAL
  Filled 2021-10-21 (×2): qty 1

## 2021-10-21 MED ORDER — PRENATAL MULTIVITAMIN CH
1.0000 | ORAL_TABLET | Freq: Every day | ORAL | Status: DC
  Administered 2021-10-22: 1 via ORAL
  Filled 2021-10-21: qty 1

## 2021-10-21 MED ORDER — FENTANYL CITRATE (PF) 100 MCG/2ML IJ SOLN
INTRAMUSCULAR | Status: AC
  Filled 2021-10-21: qty 2

## 2021-10-21 MED ORDER — DEXAMETHASONE SODIUM PHOSPHATE 4 MG/ML IJ SOLN
INTRAMUSCULAR | Status: DC | PRN
  Administered 2021-10-21: 4 mg via INTRAVENOUS

## 2021-10-21 MED ORDER — SIMETHICONE 80 MG PO CHEW
80.0000 mg | CHEWABLE_TABLET | Freq: Three times a day (TID) | ORAL | Status: DC
  Administered 2021-10-22 – 2021-10-23 (×3): 80 mg via ORAL
  Filled 2021-10-21 (×4): qty 1

## 2021-10-21 MED ORDER — ZOLPIDEM TARTRATE 5 MG PO TABS: 5.0000 mg | ORAL_TABLET | Freq: Every evening | ORAL | Status: DC | PRN

## 2021-10-21 MED ORDER — DIPHENHYDRAMINE HCL 50 MG/ML IJ SOLN
INTRAMUSCULAR | Status: AC
  Filled 2021-10-21: qty 1

## 2021-10-21 MED ORDER — IBUPROFEN 600 MG PO TABS
600.0000 mg | ORAL_TABLET | Freq: Four times a day (QID) | ORAL | Status: DC
  Administered 2021-10-22 – 2021-10-23 (×3): 600 mg via ORAL
  Filled 2021-10-21 (×3): qty 1

## 2021-10-21 MED ORDER — SCOPOLAMINE 1 MG/3DAYS TD PT72
1.0000 | MEDICATED_PATCH | Freq: Once | TRANSDERMAL | Status: DC
  Administered 2021-10-21: 1.5 mg via TRANSDERMAL
  Filled 2021-10-21 (×2): qty 1

## 2021-10-21 MED ORDER — KETOROLAC TROMETHAMINE 30 MG/ML IJ SOLN: 30.0000 mg | Freq: Four times a day (QID) | INTRAMUSCULAR | Status: DC | PRN

## 2021-10-21 MED ORDER — COCONUT OIL OIL: 1.0000 | TOPICAL_OIL | Status: DC | PRN

## 2021-10-21 MED ORDER — MENTHOL 3 MG MT LOZG: 1.0000 | LOZENGE | OROMUCOSAL | Status: DC | PRN

## 2021-10-21 SURGICAL SUPPLY — 38 items
APL SKNCLS STERI-STRIP NONHPOA (GAUZE/BANDAGES/DRESSINGS) ×1
BENZOIN TINCTURE PRP APPL 2/3 (GAUZE/BANDAGES/DRESSINGS) IMPLANT
CHLORAPREP W/TINT 26ML (MISCELLANEOUS) ×2 IMPLANT
CLAMP CORD UMBIL (MISCELLANEOUS) ×1 IMPLANT
CLOSURE STERI STRIP 1/2 X4 (GAUZE/BANDAGES/DRESSINGS) IMPLANT
CLOTH BEACON ORANGE TIMEOUT ST (SAFETY) ×1 IMPLANT
DRSG OPSITE 11X17.75 LRG (GAUZE/BANDAGES/DRESSINGS) IMPLANT
DRSG OPSITE POSTOP 4X10 (GAUZE/BANDAGES/DRESSINGS) ×1 IMPLANT
ELECT REM PT RETURN 9FT ADLT (ELECTROSURGICAL) ×1
ELECTRODE REM PT RTRN 9FT ADLT (ELECTROSURGICAL) ×1 IMPLANT
EXTRACTOR VACUUM KIWI (MISCELLANEOUS) IMPLANT
GAUZE SPONGE 4X4 12PLY STRL (GAUZE/BANDAGES/DRESSINGS) IMPLANT
GLOVE BIOGEL PI IND STRL 7.0 (GLOVE) ×1 IMPLANT
GLOVE SURG LTX SZ8 (GLOVE) ×1 IMPLANT
GOWN STRL REUS W/TWL LRG LVL3 (GOWN DISPOSABLE) ×2 IMPLANT
KIT ABG SYR 3ML LUER SLIP (SYRINGE) IMPLANT
NDL HYPO 25X5/8 SAFETYGLIDE (NEEDLE) IMPLANT
NDL SPNL 20GX3.5 QUINCKE YW (NEEDLE) IMPLANT
NEEDLE HYPO 22GX1.5 SAFETY (NEEDLE) ×1 IMPLANT
NEEDLE HYPO 25X5/8 SAFETYGLIDE (NEEDLE) IMPLANT
NEEDLE SPNL 20GX3.5 QUINCKE YW (NEEDLE) IMPLANT
NS IRRIG 1000ML POUR BTL (IV SOLUTION) ×1 IMPLANT
PACK C SECTION WH (CUSTOM PROCEDURE TRAY) ×1 IMPLANT
SUT MNCRL 0 VIOLET CTX 36 (SUTURE) ×2 IMPLANT
SUT MNCRL AB 3-0 PS2 27 (SUTURE) IMPLANT
SUT MON AB 2-0 CT1 27 (SUTURE) ×1 IMPLANT
SUT MON AB-0 CT1 36 (SUTURE) ×2 IMPLANT
SUT MONOCRYL 0 CTX 36 (SUTURE) ×3
SUT PLAIN 0 NONE (SUTURE) IMPLANT
SUT PLAIN 2 0 (SUTURE) ×1
SUT PLAIN 2 0 XLH (SUTURE) IMPLANT
SUT PLAIN ABS 2-0 CT1 27XMFL (SUTURE) IMPLANT
SUT VIC AB 4-0 KS 27 (SUTURE) IMPLANT
SYR 20CC LL (SYRINGE) IMPLANT
SYR CONTROL 10ML LL (SYRINGE) ×1 IMPLANT
TOWEL OR 17X24 6PK STRL BLUE (TOWEL DISPOSABLE) ×1 IMPLANT
TRAY FOLEY W/BAG SLVR 14FR LF (SET/KITS/TRAYS/PACK) ×1 IMPLANT
WATER STERILE IRR 1000ML POUR (IV SOLUTION) ×1 IMPLANT

## 2021-10-21 NOTE — Anesthesia Procedure Notes (Signed)
Spinal  Patient location during procedure: OR Start time: 10/21/2021 11:55 AM End time: 10/21/2021 12:10 PM Reason for block: surgical anesthesia Staffing Anesthesiologist: Bethena Midget, MD Performed by: Bethena Midget, MD Authorized by: Bethena Midget, MD   Preanesthetic Checklist Completed: patient identified, IV checked, site marked, risks and benefits discussed, surgical consent, monitors and equipment checked, pre-op evaluation and timeout performed Spinal Block Patient position: sitting Prep: DuraPrep Patient monitoring: heart rate, cardiac monitor, continuous pulse ox and blood pressure Approach: midline Location: L3-4 Injection technique: single-shot Needle Needle type: Sprotte  Needle gauge: 24 G Needle length: 9 cm Assessment Sensory level: T4 Events: CSF return and second provider Additional Notes First spinal by student without effect. Second by myself without problems

## 2021-10-21 NOTE — Anesthesia Preprocedure Evaluation (Signed)
Anesthesia Evaluation  Patient identified by MRN, date of birth, ID band Patient awake    Reviewed: Allergy & Precautions, NPO status , Patient's Chart, lab work & pertinent test results  History of Anesthesia Complications Negative for: history of anesthetic complications  Airway Mallampati: I   Neck ROM: Full    Dental  (+) Teeth Intact   Pulmonary neg pulmonary ROS,    Pulmonary exam normal        Cardiovascular negative cardio ROS Normal cardiovascular exam     Neuro/Psych negative neurological ROS  negative psych ROS   GI/Hepatic negative GI ROS, Neg liver ROS,   Endo/Other  diabetes, GestationalHypothyroidism  Hashimoto's thyroiditis   Renal/GU negative Renal ROS     Musculoskeletal negative musculoskeletal ROS (+)   Abdominal   Peds  Hematology  (+) Blood dyscrasia, anemia ,  Plt 166k    Anesthesia Other Findings    Reproductive/Obstetrics (+) Pregnancy                             Anesthesia Physical  Anesthesia Plan  ASA: 3  Anesthesia Plan: Spinal   Post-op Pain Management: Minimal or no pain anticipated   Induction: Intravenous  PONV Risk Score and Plan: 2 and Treatment may vary due to age or medical condition, Ondansetron and Scopolamine patch - Pre-op  Airway Management Planned: Natural Airway  Additional Equipment: None  Intra-op Plan:   Post-operative Plan:   Informed Consent: I have reviewed the patients History and Physical, chart, labs and discussed the procedure including the risks, benefits and alternatives for the proposed anesthesia with the patient or authorized representative who has indicated his/her understanding and acceptance.       Plan Discussed with: CRNA and Anesthesiologist  Anesthesia Plan Comments: (Labs reviewed, platelets acceptable. Discussed risks and benefits of spinal, including spinal/epidural hematoma, infection, failed  block, and PDPH. Patient expressed understanding and wished to proceed. )        Anesthesia Quick Evaluation

## 2021-10-21 NOTE — Lactation Note (Signed)
This note was copied from a baby's chart. Lactation Consultation Note  Patient Name: Rebecca Cooley [id number] Date: 10/21/2021 Reason for consult: Initial assessment;Difficult latch;Term Age:35 hours  RN called about latch difficulty. Parent states baby cannot latch to the right breast.  Per parent first baby had a tongue tie but when taken to get an eval. Was told "it was not that bed".  Baby could not latch; mom pumped for 4 months.  Placed baby STS. Tissue is compressible.  Taught mom to sandwich breast/teacup tissue to help protrude tissue with latch.  Baby grasped breast but breast support needed for baby to not slip. Several attempts made but after several sucks, nipple slipped from the mouth.    Mom was able to successfully latch baby to left side, and continuous sucking noted.    Suggested STS, hand expression, and feed with cues.   Parent goes to [place].  Suggested OP LC Beth Sanders or [employer] [facility] [employer]. Resources provided.  Maternal Data Has patient been taught Hand Expression?: Yes Does the patient have breastfeeding experience prior to this delivery?: Yes How long did the patient breastfeed?: pumped for 4 months with 56 month old;  Feeding Mother's Current Feeding Choice: Breast Milk  LATCH Score Latch: Repeated attempts needed to sustain latch, nipple held in mouth throughout feeding, stimulation needed to elicit sucking reflex.  Audible Swallowing: A few with stimulation  Type of Nipple: Inverted (left is everted, right inverted but compressible)  Comfort (Breast/Nipple): Soft / non-tender  Hold (Positioning): Assistance needed to correctly position infant at breast and maintain latch.  LATCH Score: 5   Lactation Tools Discussed/Used Tools: Pump Breast pump type: Manual Reason for Pumping: evert tissue Pumping frequency: pre pump  Interventions Interventions: Breast feeding basics reviewed;Skin to skin;Assisted with latch;Hand  express;Pre-pump if needed;Support pillows;Position options;LC Services brochure  Discharge Discharge Education: Outpatient recommendation ([facility] or [facility]: Beth) Pump: Manual  Consult Status Consult Status: Follow-up Date: 10/22/21 Follow-up type: In-patient    Maryruth Hancock [facility] 10/21/2021, 9:32 PM

## 2021-10-21 NOTE — H&P (Addendum)
Rebecca Cooley is a 36 y.o. female presenting for rpt csection. . OB History     Gravida  3   Para  2   Term  2   Preterm      AB      Living  2      SAB      IAB      Ectopic      Multiple  0   Live Births  2          Past Medical History:  Diagnosis Date   Chest pain 08/29/2017   Gestational diabetes    Hypothyroidism    Thyroid disease    Past Surgical History:  Procedure Laterality Date   CESAREAN SECTION N/A 08/03/2020   Procedure: Primary CESAREAN SECTION;  Surgeon: Olivia Mackie, MD;  Location: [facility] LD ORS;  Service: Obstetrics;  Laterality: N/A;  EDD: 08/08/20   Family History: family history includes Dementia in her father; Diabetes in her maternal grandmother and mother; Hypertension in her mother. Social History:  reports that she has never smoked. She has never used smokeless tobacco. She reports that she does not currently use alcohol after a past usage of about 2.0 - 3.0 standard drinks of alcohol per week. She reports that she does not use drugs.     Maternal Diabetes: No Genetic Screening: Normal Maternal Ultrasounds/Referrals: Normal Fetal Ultrasounds or other Referrals:  None Maternal Substance Abuse:  No Significant Maternal Medications:  None Significant Maternal Lab Results:  Group B Strep negative Number of Prenatal Visits:greater than 3 verified prenatal visits Other Comments:  None  Review of Systems  Constitutional: Negative.   All other systems reviewed and are negative.  Maternal Medical History:  Reason for admission: Contractions.   Contractions: Onset was less than 1 hour ago.   Frequency: rare.   Perceived severity is mild.   Fetal activity: Perceived fetal activity is normal.   Last perceived fetal movement was within the past hour.   Prenatal complications: no prenatal complications Prenatal Complications - Diabetes: none.     unknown if currently breastfeeding. Maternal Exam:  Uterine Assessment:  Contraction strength is mild.  Contraction frequency is rare.  Abdomen: Patient reports no abdominal tenderness. Surgical scars: low transverse.   Fetal presentation: vertex Introitus: Normal vulva. Normal vagina.  Ferning test: not done.  Nitrazine test: not done. Amniotic fluid character: not assessed. Cervix: Cervix evaluated by digital exam.     Physical Exam Constitutional:      Appearance: Normal appearance. She is normal weight.  HENT:     Head: Normocephalic and atraumatic.  Cardiovascular:     Rate and Rhythm: Normal rate and regular rhythm.     Pulses: Normal pulses.     Heart sounds: Normal heart sounds.  Pulmonary:     Effort: Pulmonary effort is normal.     Breath sounds: Normal breath sounds.  Abdominal:     General: Bowel sounds are normal.     Palpations: Abdomen is soft.  Genitourinary:    General: Normal vulva.  Musculoskeletal:        General: Normal range of motion.     Cervical back: Normal range of motion and neck supple.  Skin:    General: Skin is warm and dry.  Neurological:     General: No focal deficit present.     Mental Status: She is alert and oriented to person, place, and time.  Psychiatric:        Mood and  Affect: Mood normal.        Behavior: Behavior normal.     Prenatal labs: ABO, Rh: --/--/A POS (09/08 5462) Antibody: NEG (09/08 0949) Rubella: Immune (02/20 0000) RPR: NON REACTIVE (09/08 0949)  HBsAg: Negative (02/20 0000)  HIV: Non-reactive (02/20 0000)  GBS: Negative/-- (08/17 0000)   Assessment/Plan: 39wk IUP Previous csection for rpt Closely spaced pregnancy interval with previous csection for breech. MFM quoted uterine rupture rate approx 3%. Pt declines TOLAC at this time. Proceed with csection and tubal ligation. Surgical risks discussed. Risks of anesthesia , infection, bleeding , with injury to surrounding organs with need for repair discussed.. Failure risks of TL noted.   Damondre Pfeifle J 10/21/2021, 9:01  AM

## 2021-10-21 NOTE — Transfer of Care (Signed)
Immediate Anesthesia Transfer of Care Note  Patient: Rebecca Cooley  Procedure(s) Performed: Repeat CESAREAN SECTION  Patient Location: PACU  Anesthesia Type:Spinal  Level of Consciousness: awake, alert  and oriented  Airway & Oxygen Therapy: Patient Spontanous Breathing  Post-op Assessment: Report given to RN and Post -op Vital signs reviewed and stable  Post vital signs: Reviewed and stable  Last Vitals:  Vitals Value Taken Time  BP 111/74 10/21/21 1331  Temp    Pulse 83 10/21/21 1335  Resp 17 10/21/21 1335  SpO2 100 % 10/21/21 1335  Vitals shown include unvalidated device data.  Last Pain:  Vitals:   10/21/21 1039  TempSrc: Oral  PainSc: 0-No pain         Complications: No notable events documented.

## 2021-10-21 NOTE — Anesthesia Postprocedure Evaluation (Deleted)
Anesthesia Post Note  Patient: Rebecca Cooley  Procedure(s) Performed: Repeat CESAREAN SECTION     Patient location during evaluation: Mother Baby Anesthesia Type: Spinal Level of consciousness: awake, oriented and awake and alert Pain management: pain level controlled Vital Signs Assessment: post-procedure vital signs reviewed and stable Respiratory status: spontaneous breathing, respiratory function stable and nonlabored ventilation Cardiovascular status: stable Postop Assessment: no headache, adequate PO intake, able to ambulate, patient able to bend at knees and no apparent nausea or vomiting Anesthetic complications: no   No notable events documented. HR 77, RR18, SaO2 100%, BP 111/74  Last Vitals:  Vitals:   10/21/21 1039  BP: 126/84  Pulse: 92  Resp: 16  Temp: 36.6 C  SpO2: 99%    Last Pain:  Vitals:   10/21/21 1039  TempSrc: Oral  PainSc: 0-No pain   Pain Goal:                   [name]

## 2021-10-21 NOTE — Anesthesia Postprocedure Evaluation (Signed)
Anesthesia Post Note  Patient: SHAUNIECE KWAN  Procedure(s) Performed: Repeat CESAREAN SECTION     Patient location during evaluation: PACU Anesthesia Type: Spinal Level of consciousness: oriented and awake and alert Pain management: pain level controlled Vital Signs Assessment: post-procedure vital signs reviewed and stable Respiratory status: spontaneous breathing, respiratory function stable and patient connected to nasal cannula oxygen Cardiovascular status: blood pressure returned to baseline and stable Postop Assessment: no headache, no backache and no apparent nausea or vomiting Anesthetic complications: no   No notable events documented.  Last Vitals:  Vitals:   10/21/21 1445 10/21/21 1500  BP: 122/74 122/71  Pulse: 73 70  Resp: 19 15  Temp:    SpO2: 98% 99%    Last Pain:  Vitals:   10/21/21 1500  TempSrc:   PainSc: 0-No pain   Pain Goal:    LLE Motor Response: Purposeful movement (10/21/21 1500) LLE Sensation: Tingling (10/21/21 1500) RLE Motor Response: Purposeful movement (10/21/21 1500) RLE Sensation: Tingling (10/21/21 1500)        Mustaf Antonacci

## 2021-10-21 NOTE — Progress Notes (Signed)
Called Pt to see if she could come in earlier today due to a recent cancellation. No answer. Left Message. Will await pt to call back.

## 2021-10-21 NOTE — Op Note (Signed)
Cesarean Section Procedure Note  Indications: previous uterine incision kerr x one  Pre-operative Diagnosis: 39 week 2 day pregnancy. Elective TL  Post-operative Diagnosis: same LUS window  Surgeon: Lenoard Aden   Assistants: Yetta Barre, CNM  Anesthesia: Local anesthesia 0.25.% bupivacaine and Spinal anesthesia  ASA Class: 2  Procedure Details  The patient was seen in the Holding Room. The risks, benefits, complications, treatment options, and expected outcomes were discussed with the patient.  The patient concurred with the proposed plan, giving informed consent. The risks of anesthesia, infection, bleeding and possible injury to other organs discussed. Injury to bowel, bladder, or ureter with possible need for repair discussed. Possible need for transfusion with secondary risks of hepatitis or HIV acquisition discussed. Post operative complications to include but not limited to DVT, PE and Pneumonia noted. The site of surgery properly noted/marked. The patient was taken to Operating Room # A, identified as [address] and the procedure verified as C-Section Delivery. A Time Out was held and the above information confirmed.  After induction of anesthesia, the patient was draped and prepped in the usual sterile manner. A Pfannenstiel incision was made and carried down through the subcutaneous tissue to the fascia. Fascial incision was made and extended transversely using Mayo scissors. The fascia was separated from the underlying rectus tissue superiorly and inferiorly. The peritoneum was identified and entered. Peritoneum entered directly.The utero-vesical peritoneal reflection was incised transversely but LUS was so thin that inadvertent amniotomy performed A low transverse uterine incision(Kerr hysterotomy) was made. Delivered from OA presentation was a  female with Apgar scores of 8 at one minute and 9 at five minutes. Bulb suctioning gently performed. Neonatal team in attendance.After  the umbilical cord was clamped and cut cord blood was obtained for evaluation. The placenta was removed intact and appeared normal. The uterus was curetted with a dry lap pack. Good hemostasis was noted.The uterine outline, tubes and ovaries appeared normal. The uterine incision was closed with running locked sutures of 0 Monocryl x 1 layers. Hemostasis was observed. Bilateral modified partial [place] salpingectomy without complications.  Lavage was carried out until clear.The fascia was then reapproximated with running sutures of 0 Monocryl. The skin was reapproximated with 4-0 vicryl after [place] closure with 2-0 plain.  Instrument, sponge, and needle counts were correct prior the abdominal closure and at the conclusion of the case.   Findings: LUS window, nl tubes and ovaries  Estimated Blood Loss:  300 mL         Drains: foley                 Specimens: placenta and tubal segments                 Complications:  None; patient tolerated the procedure well.         Disposition: PACU - hemodynamically stable.         Condition: stable  Attending Attestation: I performed the procedure.

## 2021-10-22 LAB — CBC
HCT: 31.4 % — ABNORMAL LOW (ref 36.0–46.0)
Hemoglobin: 10.6 g/dL — ABNORMAL LOW (ref 12.0–15.0)
MCH: 30.2 pg (ref 26.0–34.0)
MCHC: 33.8 g/dL (ref 30.0–36.0)
MCV: 89.5 fL (ref 80.0–100.0)
Platelets: 160 10*3/uL (ref 150–400)
RBC: 3.51 MIL/uL — ABNORMAL LOW (ref 3.87–5.11)
RDW: 13.2 % (ref 11.5–15.5)
WBC: 13.4 10*3/uL — ABNORMAL HIGH (ref 4.0–10.5)
nRBC: 0 % (ref 0.0–0.2)

## 2021-10-22 NOTE — Social Work (Signed)
CSW received consult for [place]. CSW met with MOB to offer support and complete assessment.    CSW met with MOB at bedside and introduced CSW role. CSW observed MOB in bed with the infant lying next to her and FOB present at bedside. CSW offered MOB privacy. MOB gave CSW permission to share all information with FOB present. MOB presented pleasant and engaged with CSW during the visit. CSW discussed the [place]. CSW inquired how MOB has felt since giving birth. MOB stated, "it's been an emotional roller coaster." MOB expressed that she has been happy and anxious as it relates to being " a mom with 2 under 2." MOB expressed feeling overwhelmed at times. CSW provided active listening and validated MOB feelings. MOB denied a history of anxiety and depression. MOB reported she had the baby blues around week three following the birth of her son. MOB stated, "I willed myself out of it." MOB expressed she is connected with a mom group through her church which she feels is very helpful. MOB identified her family and church as her primary supports.   CSW provided education regarding the baby blues period vs. perinatal mood disorders, discussed treatment and gave resources for mental health follow up if concerns arise. MOB reported that she has access to counselor via [employer] however the company offers a few visit and she prefers to do more than a couple of visits. CSW encouraged MOB to reach out to the mental health providers listed that accepts her current insurance and ask about their services/billing. CSW recommended MOB complete a self-evaluation during the postpartum time period using the New Mom Checklist from Postpartum Progress and encouraged MOB to contact a medical professional if symptoms are noted at any time. CSW assessed MOB for safety. MOB denied thoughts of harm to self and others.   MOB reported she has all items for the infant including a bassinet where the infant will sleep. CSW provided review of  Sudden Infant Death Syndrome (SIDS) precautions. MOB has chosen [facility] for the infant's follow up care. CSW assessed MOB for additional needs. MOB reported no further needs.    CSW identifies no further need for intervention and no barriers to discharge at this time.   Kathrin Greathouse, MSW, LCSW [facility] [place] Worker  [phone] 10/22/2021  1:10 PM

## 2021-10-22 NOTE — Lactation Note (Signed)
This note was copied from a baby's chart. Lactation Consultation Note  Patient Name: Girl Jasmarie Coppock [id number] Date: 10/22/2021 Reason for consult: Follow-up assessment;Difficult latch;Term;Nipple pain/trauma Age:35 hours  LC in to visit with P3 Mom of term baby.  Baby is at a 4% weight loss and has had adequate output.  Baby hasn't been able to attain a deep latch and Mom has supplemented baby with formula a couple times.   Mom's nipples are semi-flat and center of nipples dimple in.  Reassured Mom that baby would need to latch to areola, not the nipple.  Areola is compressible.  Mom pre-pumped using the hand pump and attempted several times to latch baby, but baby repeatedly slips onto nipple.  Mom has a lot of tenderness on her nipples.    LC initiated a 20 mm nipple shield showing Mom how to properly apply to pull nipple into shield.  Mom pre-pumped to evert nipple more.  Mom needing a lot of guidance on breast support and baby's head support.  LC instilled formula with curved tip syringe into shield to help entice baby to suckle which she did for 15 mins taking 5 ml.  Baby burped and switched to left breast.  Mom is wincing with pain.  LC initiated the 20 mm nipple shield on 2nd breast instilling formula into shield and baby sucked with a much deep suck pattern.  Mom feeling a tug on her nipples.  When baby came off, nipple pulled further into shield.    LC set up DEBP and instructed on how to use the initiation setting.  24 mm flanges are a good fit.  Plan- 1- STS as much as possible 2- Offer the breast with feeding cues.  Mom to pre-pump using hand pump and apply the 20 mm nipple shield. 3- Use formula or EBM into shield 10-20 ml 4- Pump both breasts on initiation setting, asking for help prn    LATCH Score Latch: Repeated attempts needed to sustain latch, nipple held in mouth throughout feeding, stimulation needed to elicit sucking reflex.  Audible Swallowing: A few with  stimulation (with formula supplementation in nipple shield)  Type of Nipple: Everted at rest and after stimulation (Nipples have dimples in centers, and are short shafted)  Comfort (Breast/Nipple): Filling, red/small blisters or bruises, mild/mod discomfort  Hold (Positioning): Assistance needed to correctly position infant at breast and maintain latch.  LATCH Score: 6   Lactation Tools Discussed/Used Tools: Nipple Dorris Carnes;Pump Nipple shield size: 20 Breast pump type: Manual Pump Education: Milk Storage;Setup, frequency, and cleaning Reason for Pumping: pre-pump before latching Pumping frequency: Encouraged to pump 15 mins after each breastfeeding  Interventions Interventions: Breast feeding basics reviewed;Assisted with latch;Skin to skin;Breast massage;Hand express;Pre-pump if needed;Breast compression;Adjust position;Support pillows;Position options;Hand pump;DEBP;Coconut oil  Discharge Pump: Personal;DEBP  Consult Status Consult Status: Follow-up Date: 10/23/21 Follow-up type: In-patient    Judee Clara 10/22/2021, 12:35 PM

## 2021-10-22 NOTE — Progress Notes (Signed)
   Subjective: POD# 1 Live born female  Birth Weight: 8 lb 1.5 oz (3670 g) APGAR: 9, 9  Newborn Delivery   Birth date/time: 10/21/2021 12:25:00 Delivery type: C-Section, Low Transverse Trial of labor: No C-section categorization: Repeat     Baby name: Milani  Delivering provider: Olivia Mackie   Feeding: breast and bottle  Pain control at delivery: Spinal   Reports feeling well.  Patient reports tolerating PO.   Pain controlled with acetaminophen and prescription NSAID's including ketorolac (Toradol) Denies HA/SOB/C/P/N/V/dizziness. She reports vaginal bleeding as normal, without clots. She is ambulating and urinating without difficulty.     Objective: Vitals:   10/21/21 1528 10/21/21 1725 10/21/21 2323 10/22/21 0247  BP: 117/76 108/70 107/63 (!) 100/57  Pulse: 80 68 70 67  Resp: 16 18 18 16   Temp: 97.9 F (36.6 C) 97.8 F (36.6 C) 98.5 F (36.9 C) 98 F (36.7 C)  TempSrc: Oral Oral Oral Axillary  SpO2: 96%  96% 97%  Weight:      Height:        Intake/Output Summary (Last 24 hours) at 10/22/2021 1102 Last data filed at 10/22/2021 0530 Gross per 24 hour  Intake 800 ml  Output 1643 ml  Net -843 ml      Recent Labs    10/22/21 0544  WBC 13.4*  HGB 10.6*  HCT 31.4*  PLT 160    Blood type: --/--/A POS (09/08 09-29-1999)  Rubella: Immune (02/20 0000)   Physical Exam:  General: alert and cooperative CV: Regular rate and rhythm Resp: clear Abdomen: soft, nontender, normal bowel sounds Incision: clean, dry, and pressure dressing intact Uterine Fundus: firm, below umbilicus, nontender Lochia: minimal Ext: extremities normal, atraumatic, no cyanosis or edema  Assessment/Plan: 36 y.o.   POD# 1. 31                  Principal Problem:   Postpartum care following cesarean delivery 9/10  Encourage rest when baby rests Breastfeeding support Encourage to ambulate Routine post-op care Active Problems:   Status post repeat low transverse cesarean section    Hypothyroidism  Continue levothyroxine 11/10 daily   Previous cesarean section   Previous cesarean delivery affecting pregnancy  Anticipate discharge tomorrow.            , CNM, MSN 10/22/2021, 11:02 AM

## 2021-10-23 MED ORDER — ACETAMINOPHEN 500 MG PO TABS: 1000.0000 mg | ORAL_TABLET | Freq: Four times a day (QID) | ORAL | 0 refills | Status: AC

## 2021-10-23 MED ORDER — OXYCODONE HCL 5 MG PO TABS: 5.0000 mg | ORAL_TABLET | ORAL | 0 refills | Status: AC | PRN

## 2021-10-23 MED ORDER — IBUPROFEN 600 MG PO TABS: 600.0000 mg | ORAL_TABLET | Freq: Four times a day (QID) | ORAL | 0 refills | Status: AC

## 2021-10-23 NOTE — Discharge Summary (Signed)
OB Discharge Summary  Patient Name: Rebecca Cooley DOB: 1985/12/02 MRN: [id number]  Date of admission: 10/21/2021 Delivering provider: Olivia Mackie   Admitting diagnosis: Previous cesarean section [Z98.891] Previous cesarean delivery affecting pregnancy [O34.219] Intrauterine pregnancy: [redacted]w[redacted]d     Secondary diagnosis: Patient Active Problem List   Diagnosis Date Noted   Status post repeat low transverse cesarean section 10/21/2021   Hypothyroidism 10/21/2021   Previous cesarean section 10/21/2021   Previous cesarean delivery affecting pregnancy 10/21/2021   Postpartum care following cesarean delivery 9/10 08/03/2020    Date of discharge: 10/23/2021   Discharge diagnosis: Principal Problem:   Postpartum care following cesarean delivery 9/10 Active Problems:   Status post repeat low transverse cesarean section   Hypothyroidism   Previous cesarean section   Previous cesarean delivery affecting pregnancy                                                            Augmentation: N/A Pain control: Spinal  Laceration:None  Complications: None  Hospital course:  Scheduled C/S   36 y.o. yo G3P3003 at [redacted]w[redacted]d was admitted to the hospital 10/21/2021 for scheduled cesarean section with the following indication:Elective Repeat.Delivery details are as follows:  Membrane Rupture Time/Date: 12:24 PM ,10/21/2021   Delivery Method:C-Section, Low Transverse  Details of operation can be found in separate operative note.  Patient had an uncomplicated postpartum course.  She is ambulating, tolerating a regular diet, passing flatus, and urinating well. Patient is discharged home in stable condition on  10/23/21        Newborn Data: Birth date:10/21/2021  Birth time:12:25 PM  Gender:Female  Living status:Living  Apgars:9 ,9  Weight:3670 g     Physical exam  Vitals:   10/22/21 0247 10/22/21 1455 10/22/21 2208 10/23/21 0516  BP: (!) 100/57 112/61 117/74 120/73  Pulse: 67 67 75 70  Resp: 16  16 18 18   Temp: 98 F (36.7 C) 98.2 F (36.8 C) 99.4 F (37.4 C) (!) 97.5 F (36.4 C)  TempSrc: Axillary Oral Oral Oral  SpO2: 97% 99% 98% 98%  Weight:      Height:       General: alert, cooperative, and no distress Lochia: appropriate Uterine Fundus: firm Incision: Healing well with no significant drainage DVT Evaluation: No evidence of DVT seen on physical exam.  Labs: Lab Results  Component Value Date   WBC 13.4 (H) 10/22/2021   HGB 10.6 (L) 10/22/2021   HCT 31.4 (L) 10/22/2021   MCV 89.5 10/22/2021   PLT 160 10/22/2021      10/22/2021   12:21 AM 08/15/2020    3:26 PM 08/03/2020   10:53 PM  Edinburgh Postnatal Depression Scale Screening Tool  I have been able to laugh and see the funny side of things. 0 0 0  I have looked forward with enjoyment to things. 0 0 0  I have blamed myself unnecessarily when things went wrong. 1 0 0  I have been anxious or worried for no good reason. 2 0 2  I have felt scared or panicky for no good reason. 0 0 1  Things have been getting on top of me. 1 0 0  I have been so unhappy that I have had difficulty sleeping. 3 0 0  I have felt sad or miserable.  2 0 0  I have been so unhappy that I have been crying. 1 0 0  The thought of harming myself has occurred to me. 0 0 0  Edinburgh Postnatal Depression Scale Total 10 0 3   Discharge instructions:  per After Visit Summary  After Visit Meds:  Allergies as of 10/23/2021   No Known Allergies      Medication List     TAKE these medications    acetaminophen 500 MG tablet Commonly known as: TYLENOL Take 2 tablets (1,000 mg total) by mouth every 6 (six) hours.   ibuprofen 600 MG tablet Commonly known as: ADVIL Take 1 tablet (600 mg total) by mouth every 6 (six) hours.   levothyroxine 75 MCG tablet Commonly known as: SYNTHROID Take 75 mcg by mouth daily before breakfast.   oxyCODONE 5 MG immediate release tablet Commonly known as: Oxy IR/ROXICODONE Take 1-2 tablets (5-10 mg total)  by mouth every 4 (four) hours as needed for moderate pain.   prenatal multivitamin Tabs tablet Take 1 tablet by mouth at bedtime.       Activity: Advance as tolerated. Pelvic rest for 6 weeks.   Newborn Data: Live born female  Birth Weight: 8 lb 1.5 oz (3670 g) APGAR: 9, 9  Newborn Delivery   Birth date/time: 10/21/2021 12:25:00 Delivery type: C-Section, Low Transverse Trial of labor: No C-section categorization: Repeat     Named [place] Baby Feeding: Bottle and Breast Disposition:home with mother  Delivery Report:  Review the Delivery Report for details.    Follow up:  Follow-up Information     Olivia Mackie, MD. Schedule an appointment as soon as possible for a visit in 6 week(s).   Specialty: Obstetrics and Gynecology Contact information: [address] [place] [address] [phone]                Clancy Gourd, MSN 10/23/2021, 9:55 AM

## 2021-10-23 NOTE — Lactation Note (Signed)
This note was copied from a baby's chart. Lactation Consultation Note  Patient Name: Rebecca Cooley [id number] Date: 10/23/2021 Reason for consult: Follow-up assessment;Term Age:35 hours   P3: Term infant at 39+2 weeks Feeding preference: Breast/formula Weight loss: 8%  Family had a lactation consult yesterday.  Birth parent has not been latching at all to the breast since that visit.  She seems uncertain as to whether or not she will continue with breast feeding.  Discussed current plan using the nipple shield and latching.  Suggested continuing to place baby to the breast prior to giving formula supplementation.  Emphasized the importance of pumping every three hours to establish a milk supply.  Birth parent had no questions at this time.  Reviewed supplementation guidelines and also provided reference sheet for formula feeding.  Support person feeding baby while I visited.  Previous LC recommended an op lc visit and I also agree that this would be a good recommendation if birth parent does decide to continue latching.  Discussed with her the option of also pumping and bottle feeding her expressed milk if desired.  Birth parent verbalized understanding.   Maternal Data    Feeding Mother's Current Feeding Choice: Breast Milk and Formula Nipple Type: Extra Slow Flow  LATCH Score                    Lactation Tools Discussed/Used    Interventions    Discharge Discharge Education: Engorgement and breast care;Outpatient recommendation (Previous LC recommended an op visit; agree with previous LC recommendation) Pump: Personal  Consult Status Consult Status: Complete Date: 10/23/21 Follow-up type: Call as needed    Matan Steen R Bernita Beckstrom 10/23/2021, 11:18 AM

## 2021-10-24 LAB — SURGICAL PATHOLOGY

## 2021-10-29 ENCOUNTER — Telehealth (HOSPITAL_COMMUNITY): Payer: Self-pay | Admitting: *Deleted

## 2021-10-29 NOTE — Telephone Encounter (Signed)
Left phone voicemail message.  Odis Hollingshead, RN 10-29-2021 at 12:33pm
